# Patient Record
Sex: Male | Born: 1940 | Race: White | Hispanic: No | Smoking: Former smoker
Health system: Southern US, Community
[De-identification: ages and names within clinical notes are randomized; demographics above are authoritative.]

## PROBLEM LIST (undated history)

## (undated) DIAGNOSIS — K219 Gastro-esophageal reflux disease without esophagitis: Secondary | ICD-10-CM

## (undated) DIAGNOSIS — E785 Hyperlipidemia, unspecified: Secondary | ICD-10-CM

## (undated) DIAGNOSIS — I2510 Atherosclerotic heart disease of native coronary artery without angina pectoris: Secondary | ICD-10-CM

## (undated) HISTORY — DX: Hyperlipidemia, unspecified: E78.5

## (undated) HISTORY — PX: ANGIOPLASTY: SHX39

## (undated) HISTORY — PX: CORONARY ANGIOPLASTY: SHX604

---

## 2011-10-29 ENCOUNTER — Inpatient Hospital Stay (HOSPITAL_COMMUNITY)
Admission: EM | Admit: 2011-10-29 | Discharge: 2011-11-01 | DRG: 247 | Disposition: A | Discharge status: Home / Self Care | Payer: Medicare Other | Attending: Cardiology | Admitting: Cardiology

## 2011-10-29 ENCOUNTER — Inpatient Hospital Stay (HOSPITAL_COMMUNITY): Payer: Medicare Other

## 2011-10-29 ENCOUNTER — Other Ambulatory Visit: Payer: Self-pay

## 2011-10-29 ENCOUNTER — Ambulatory Visit (HOSPITAL_COMMUNITY): Admit: 2011-10-29 | Payer: Self-pay | Admitting: Cardiology

## 2011-10-29 ENCOUNTER — Encounter (HOSPITAL_COMMUNITY)
Admission: EM | Disposition: A | Discharge status: Home / Self Care | Payer: Self-pay | Source: Home / Self Care | Attending: Cardiology

## 2011-10-29 DIAGNOSIS — I2510 Atherosclerotic heart disease of native coronary artery without angina pectoris: Secondary | ICD-10-CM

## 2011-10-29 DIAGNOSIS — I2119 ST elevation (STEMI) myocardial infarction involving other coronary artery of inferior wall: Secondary | ICD-10-CM

## 2011-10-29 DIAGNOSIS — I472 Ventricular tachycardia, unspecified: Secondary | ICD-10-CM | POA: Diagnosis not present

## 2011-10-29 DIAGNOSIS — Z87891 Personal history of nicotine dependence: Secondary | ICD-10-CM

## 2011-10-29 DIAGNOSIS — Z951 Presence of aortocoronary bypass graft: Secondary | ICD-10-CM

## 2011-10-29 DIAGNOSIS — E7800 Pure hypercholesterolemia, unspecified: Secondary | ICD-10-CM | POA: Diagnosis present

## 2011-10-29 DIAGNOSIS — Z720 Tobacco use: Secondary | ICD-10-CM | POA: Diagnosis present

## 2011-10-29 DIAGNOSIS — I213 ST elevation (STEMI) myocardial infarction of unspecified site: Secondary | ICD-10-CM

## 2011-10-29 DIAGNOSIS — I4729 Other ventricular tachycardia: Secondary | ICD-10-CM | POA: Diagnosis not present

## 2011-10-29 HISTORY — DX: Gastro-esophageal reflux disease without esophagitis: K21.9

## 2011-10-29 HISTORY — PX: PERCUTANEOUS CORONARY STENT INTERVENTION (PCI-S): SHX5485

## 2011-10-29 HISTORY — DX: Atherosclerotic heart disease of native coronary artery without angina pectoris: I25.10

## 2011-10-29 HISTORY — DX: ST elevation (STEMI) myocardial infarction involving other coronary artery of inferior wall: I21.19

## 2011-10-29 HISTORY — PX: LEFT HEART CATHETERIZATION WITH CORONARY ANGIOGRAM: SHX5451

## 2011-10-29 LAB — COMPREHENSIVE METABOLIC PANEL
ALT: 21 U/L (ref 0–53)
AST: 103 U/L — ABNORMAL HIGH (ref 0–37)
Albumin: 3.8 g/dL (ref 3.5–5.2)
Alkaline Phosphatase: 33 U/L — ABNORMAL LOW (ref 39–117)
Calcium: 8.6 mg/dL (ref 8.4–10.5)
Creatinine, Ser: 0.68 mg/dL (ref 0.50–1.35)
GFR calc Af Amer: >90 mL/min (ref >90)
Glucose, Bld: 117 mg/dL — ABNORMAL HIGH (ref 70–99)
Potassium: 3.8 mEq/L (ref 3.5–5.1)
Sodium: 141 mEq/L (ref 135–145)
Total Protein: 6.7 g/dL (ref 6.0–8.3)
Total Protein: 6.8 g/dL (ref 6.0–8.3)

## 2011-10-29 LAB — DIFFERENTIAL
Basophils Absolute: 0 10*3/uL (ref 0.0–0.1)
Eosinophils Absolute: 0 10*3/uL (ref 0.0–0.7)
Eosinophils Relative: 0 % (ref 0–5)
Neutrophils Relative %: 87 % — ABNORMAL HIGH (ref 43–77)

## 2011-10-29 LAB — CBC
Hemoglobin: 14.9 g/dL (ref 13.0–17.0)
MCH: 32.2 pg (ref 26.0–34.0)
MCHC: 34.6 g/dL (ref 30.0–36.0)
MCV: 91.8 fL (ref 78.0–100.0)
Platelets: 176 10*3/uL (ref 150–400)
RBC: 4.67 MIL/uL (ref 4.22–5.81)
RDW: 12.6 % (ref 11.5–15.5)
WBC: 13.3 10*3/uL — ABNORMAL HIGH (ref 4.0–10.5)

## 2011-10-29 LAB — MRSA PCR SCREENING: MRSA by PCR: NEGATIVE

## 2011-10-29 LAB — CARDIAC PANEL(CRET KIN+CKTOT+MB+TROPI)
CK, MB: 121.1 ng/mL (ref 0.3–4.0)
CK, MB: 136 ng/mL (ref 0.3–4.0)
Total CK: 1155 U/L — ABNORMAL HIGH (ref 7–232)
Troponin I: 11.96 ng/mL (ref <0.30)
Troponin I: >25 ng/mL (ref <0.30)

## 2011-10-29 LAB — POCT I-STAT, CHEM 8
BUN: 10 mg/dL (ref 6–23)
Chloride: 105 mEq/L (ref 96–112)
Creatinine, Ser: 0.9 mg/dL (ref 0.50–1.35)
Potassium: 3.9 mEq/L (ref 3.5–5.1)
Sodium: 141 mEq/L (ref 135–145)
TCO2: 24 mmol/L (ref 0–100)

## 2011-10-29 LAB — POCT I-STAT TROPONIN I

## 2011-10-29 LAB — TSH: TSH: 0.624 u[IU]/mL (ref 0.350–4.500)

## 2011-10-29 SURGERY — PERCUTANEOUS CORONARY STENT INTERVENTION (PCI-S)
Anesthesia: LOCAL

## 2011-10-29 MED ORDER — ASPIRIN 81 MG PO CHEW: 264.0000 mg | CHEWABLE_TABLET | Freq: Once | ORAL | Status: AC

## 2011-10-29 MED ORDER — ASPIRIN 81 MG PO CHEW: 81.0000 mg | CHEWABLE_TABLET | Freq: Every day | ORAL | Status: DC

## 2011-10-29 MED ORDER — SIMVASTATIN 40 MG PO TABS
40.0000 mg | ORAL_TABLET | Freq: Every day | ORAL | Status: DC
  Administered 2011-10-29 – 2011-10-31 (×3): 40 mg via ORAL
  Filled 2011-10-29 (×4): qty 1

## 2011-10-29 MED ORDER — FENTANYL CITRATE 0.05 MG/ML IJ SOLN
INTRAMUSCULAR | Status: AC
  Filled 2011-10-29: qty 2

## 2011-10-29 MED ORDER — SODIUM CHLORIDE 0.9 % IV SOLN: INTRAVENOUS | Status: AC

## 2011-10-29 MED ORDER — NITROGLYCERIN IN D5W 200-5 MCG/ML-% IV SOLN: 2.0000 ug/min | INTRAVENOUS | Status: DC

## 2011-10-29 MED ORDER — MORPHINE SULFATE 2 MG/ML IJ SOLN: 2.0000 mg | INTRAMUSCULAR | Status: DC | PRN

## 2011-10-29 MED ORDER — SODIUM CHLORIDE 0.9 % IV SOLN
30.0000 mL | INTRAVENOUS | Status: DC
  Administered 2011-10-29: 30 mL via INTRAVENOUS

## 2011-10-29 MED ORDER — VERAPAMIL HCL 2.5 MG/ML IV SOLN
INTRAVENOUS | Status: AC
  Filled 2011-10-29: qty 2

## 2011-10-29 MED ORDER — PRASUGREL HCL 10 MG PO TABS: 10.0000 mg | ORAL_TABLET | Freq: Every day | ORAL | Status: DC

## 2011-10-29 MED ORDER — ENOXAPARIN SODIUM 30 MG/0.3ML ~~LOC~~ SOLN
30.0000 mg | SUBCUTANEOUS | Status: DC
  Filled 2011-10-29: qty 0.3

## 2011-10-29 MED ORDER — HEPARIN (PORCINE) IN NACL 2-0.9 UNIT/ML-% IJ SOLN
INTRAMUSCULAR | Status: AC
  Filled 2011-10-29: qty 2000

## 2011-10-29 MED ORDER — ALPRAZOLAM 0.25 MG PO TABS: 0.2500 mg | ORAL_TABLET | Freq: Two times a day (BID) | ORAL | Status: DC | PRN

## 2011-10-29 MED ORDER — LIDOCAINE HCL (PF) 1 % IJ SOLN
INTRAMUSCULAR | Status: AC
  Filled 2011-10-29: qty 30

## 2011-10-29 MED ORDER — BIVALIRUDIN 250 MG IV SOLR
INTRAVENOUS | Status: AC
  Filled 2011-10-29: qty 250

## 2011-10-29 MED ORDER — ASPIRIN 81 MG PO CHEW
81.0000 mg | CHEWABLE_TABLET | Freq: Every day | ORAL | Status: DC
  Administered 2011-10-30 – 2011-11-01 (×3): 81 mg via ORAL
  Filled 2011-10-29 (×3): qty 1

## 2011-10-29 MED ORDER — ASPIRIN EC 81 MG PO TBEC: 81.0000 mg | DELAYED_RELEASE_TABLET | Freq: Every day | ORAL | Status: DC

## 2011-10-29 MED ORDER — METOPROLOL TARTRATE 12.5 MG HALF TABLET
12.5000 mg | ORAL_TABLET | Freq: Two times a day (BID) | ORAL | Status: DC
  Administered 2011-10-29 – 2011-11-01 (×7): 12.5 mg via ORAL
  Filled 2011-10-29 (×11): qty 1

## 2011-10-29 MED ORDER — ASPIRIN 81 MG PO CHEW
CHEWABLE_TABLET | ORAL | Status: AC
  Administered 2011-10-29: 243 mg
  Filled 2011-10-29: qty 3

## 2011-10-29 MED ORDER — ONDANSETRON HCL 4 MG/2ML IJ SOLN
INTRAMUSCULAR | Status: AC
  Filled 2011-10-29: qty 2

## 2011-10-29 MED ORDER — HEPARIN SODIUM (PORCINE) 5000 UNIT/ML IJ SOLN
INTRAMUSCULAR | Status: AC
  Administered 2011-10-29: 4000 [IU] via INTRAVENOUS
  Filled 2011-10-29: qty 1

## 2011-10-29 MED ORDER — ONDANSETRON HCL 4 MG/2ML IJ SOLN: 4.0000 mg | Freq: Four times a day (QID) | INTRAMUSCULAR | Status: DC | PRN

## 2011-10-29 MED ORDER — NITROGLYCERIN 0.2 MG/ML ON CALL CATH LAB
INTRAVENOUS | Status: AC
  Filled 2011-10-29: qty 1

## 2011-10-29 MED ORDER — NITROGLYCERIN 0.4 MG SL SUBL: 0.4000 mg | SUBLINGUAL_TABLET | SUBLINGUAL | Status: DC | PRN

## 2011-10-29 MED ORDER — ASPIRIN 81 MG PO CHEW: 324.0000 mg | CHEWABLE_TABLET | ORAL | Status: DC

## 2011-10-29 MED ORDER — ATROPINE SULFATE 1 MG/ML IJ SOLN
INTRAMUSCULAR | Status: AC
  Filled 2011-10-29: qty 1

## 2011-10-29 MED ORDER — PRASUGREL HCL 10 MG PO TABS
10.0000 mg | ORAL_TABLET | Freq: Every day | ORAL | Status: DC
  Administered 2011-10-30 – 2011-11-01 (×3): 10 mg via ORAL
  Filled 2011-10-29 (×3): qty 1

## 2011-10-29 MED ORDER — ACETAMINOPHEN 325 MG PO TABS: 650.0000 mg | ORAL_TABLET | ORAL | Status: DC | PRN

## 2011-10-29 MED ORDER — PRASUGREL HCL 10 MG PO TABS
ORAL_TABLET | ORAL | Status: AC
  Filled 2011-10-29: qty 6

## 2011-10-29 MED ORDER — ASPIRIN 300 MG RE SUPP
300.0000 mg | RECTAL | Status: DC
  Filled 2011-10-29: qty 1

## 2011-10-29 MED ORDER — HEPARIN SODIUM (PORCINE) 5000 UNIT/ML IJ SOLN
4000.0000 [IU] | INTRAMUSCULAR | Status: AC
  Administered 2011-10-29: 4000 [IU] via INTRAVENOUS

## 2011-10-29 MED ORDER — SODIUM CHLORIDE 0.9 % IV SOLN: INTRAVENOUS | Status: DC

## 2011-10-29 MED ORDER — MIDAZOLAM HCL 2 MG/2ML IJ SOLN
INTRAMUSCULAR | Status: AC
  Filled 2011-10-29: qty 2

## 2011-10-29 MED ORDER — NITROGLYCERIN IN D5W 200-5 MCG/ML-% IV SOLN
INTRAVENOUS | Status: AC
  Filled 2011-10-29: qty 250

## 2011-10-29 MED ORDER — ASPIRIN 325 MG PO TABS: 325.0000 mg | ORAL_TABLET | Freq: Every day | ORAL | Status: DC

## 2011-10-29 MED ORDER — SODIUM CHLORIDE 0.45 % IV SOLN: INTRAVENOUS | Status: DC

## 2011-10-29 MED ORDER — ADULT MULTIVITAMIN W/MINERALS CH
1.0000 | ORAL_TABLET | Freq: Every day | ORAL | Status: DC
  Administered 2011-10-29 – 2011-11-01 (×4): 1 via ORAL
  Filled 2011-10-29 (×4): qty 1

## 2011-10-29 NOTE — ED Notes (Signed)
MD at bedside.Dr Riley Kill with Cardiology and Dr. Anitra Lauth at bedside with patient.

## 2011-10-29 NOTE — Progress Notes (Signed)
Call to Dr. Riley Kill re:  10 beat run of v-tach, 6 beat runs of v-tach, occas couplet, salvo, bigeminal pvc's.  Pt totally asymptomatic. Aware metoprolol 2.5mg  just given.  No new orders given.  Pt talking with friends in room.  Denies chest pain or shortness of breath.

## 2011-10-29 NOTE — Op Note (Signed)
Cardiac Catheterization Procedure Note  Name: Kenneth Booth MRN: [id number] DOB: 1941-03-04  Procedure: Placement of catheters without left heart cath, Selective Coronary Angiography, , PTCA and stenting of the RCA times two with DES with adjunctive PCI without stent placement  Indication:  This gentleman is 61 yea rs old. He presented with chest pain, and EMS was called. Electrocardiogram demonstrated mild inferior ST elevation. Coat stem he was called by the [facility], and he was brought to the emergency room. The patient has previous stenting of the right coronary artery performed at [facility] in [place].  This was done in the early 1990s. He has ongoing chest pain. He was given intravenous unfractionated heparin in the emergency room as well as chewable aspirin. We discussed the indications for urgent catheterization with the patient. He consented to proceed. He currently takes no medications related to his heart.  Procedural Details:  The right wrist was prepped, draped, and anesthetized with 1% lidocaine. Using the modified Seldinger technique, a 6 French sheath was introduced into the right radial artery. 3 mg of verapamil was administered through the sheath, weight-based unfractionated heparin was administered intravenously in the ER as noted. Standard Judkins catheters were used for selective coronary angiography. Catheter exchanges were performed over an exchange length guidewire.  The patient was then noted to have a total occlusion of the right coronary artery. Bivalirudin was administered in a weight-based fashion and an ACT was checked and found to be appropriate for intervention. Prasugrel was given as 60mg  orally.  A JR 4 guiding catheter with sideholes was then utilized to cannulate the right coronary artery. A pro-water coronary guidewire was then passed across the total occlusion and into the distal vessel. Following this, a 2.25 mm balloon was then inflated in the  distal artery and reperfusion achieved approximately 33 minutes after arriving in the catheterization laboratory. The lesion was carefully assessed, and we discussed the long-term options with the patient as the anatomy would suggest that revascularization surgery would be a survival benefit to him. I explained to him that this might alter the current strategy. He was fairly emphatic about the hope to avoid surgery, much as he had been in the early 1990s according to his history. A Promus Element stent drug-eluting stent, 2.75 x 24 mm, was then placed in the distal vessel and expanded to 15 atmospheres. A 3.25 mm noncompliant balloon was used to post dilate this lesion. In the mid vessel, at the site of prior stenting from the early 90s there is partial in-stent renarrowing and a fairly high-grade lesion noted at the distal end of the stented area.  After careful evaluation, this entire area was stented with a 3.0 x 32 mm Promus Element DES. Post dilatation was then performed using both a 3.5 and 3.75 noncompliant balloons. The distal end of the stent was postdilated with a 3.75 mm balloon to high pressures. Beyond the distal stent, there was a  short focal area of narrowing in an area of segmental plaque. To cover the entire area of plaque would've required a fairly long additional drug-eluting stent, and I chose to treat the short focal area with a 2.5 x 8 noncompliant balloon this was reduced from about 70% down to 30% with good residual lumen. The patient was pain-free and improved. I elected to get left ventricular function data through echocardiography as opposed to ventriculography at this time. The patient remained stable. Bivalirudin was discontinued. He TR band was placed with 15 mm and  good hemostasis achieved. There were no major complications.  PROCEDURAL FINDINGS Hemodynamics:  AO 125/75 (97) LV not performed   Coronary angiography: Coronary dominance: right  Left mainstem: The left main is  stem artery has about 20-30% diffuse plaquing with a focal area of 20% narrowing in its midportion.  Left anterior descending (LAD): Left anterior descending artery is calcified and diffusely diseased. The first diagonal branch has multiple lesions and provides a fairly large bifurcating diagonal which is graftable. There are  high grade 90% proximal stenosis followed by an area of segmental irregular plaque beyond the diagonal vessel distally.  The mid left anterior descending artery has severe disease with up to 80-90% narrowing and then is diffusely diseased in its midportion the apical vessel, while not large in caliber does appear to be suitable for grafting.  Left circumflex (LCx): The circumflex artery is a large-caliber vessel providing one major marginal branch and appears free of significant disease.  Right coronary artery (RCA): The right coronary artery has been previously stented. In the midportion, there is approximately 50% to 60% narrowing throughout the stent followed by an area of focal disease at the distal end of the stent. Beyond the origin of the large posterior descending branch, the right coronary artery is totally occluded. Following reperfusion, there was an area of segmental disease in the continuation branch that was 90%. This was stented with a final residual stenosis of 0%. In the area of segmental disease in the previously stented area, which is the mid vessel, this area was stented with a residual stenosis of 10-20% but improved appearing lumen with excellent flow. Beyond the distal stent, there was a focal area of disease superimposed on a somewhat segmental area of plaque. The focal disease was dilated simply with the balloon. We avoided stenting this area because it would required another long stent in tandem with the other distal stent. Final angiographic result was satisfactory. With with reperfusion of the right coronary artery, there was restoration of TIMI-3 flow, and a  viable potential angiographic result.   PCI Data: Vessel - RCA/Segment - 3 Percent Stenosis (pre)  100 TIMI-flow 0 Stent Promus Element 2.75 by 24 mm.  Post dil 3.53mm Percent Stenosis (post) 0 TIMI-flow (post) 3   Second lesion:  In stent restenosis mid vessel---3.0 by 32 Promus Element DES post dil to 3.75 mm  10-20% narrowing Third lesion:  PLA  75 to 30 balloon only.   See dictation  Final Conclusions:   1.  inferior wall myocardial infarction. 2. Successful PCI of the right coronary artery involving stenting of the total occlusion, stenting of the mid vessel in-stent restenosis, and balloon angioplasty of a small focal distal lesion. 3. Severe disease involving the left anterior descending and diagonal arteries is noted in the above text.   Recommendations:  I will continue to discuss the case with the patient. Given the high grade left anterior descending and diagonal disease, the patient has a survival benefit for considering revascularization surgery. We thoroughly discussed this with the patient during the case. As in 1990, he was very reluctant to consider surgery. We attempted a more complete revascularization of the right coronary artery as a result. It would still potentially be suitable for grafting with a large posterior descending and posterolateral branches, with segmental plaque involving the posterolateral segment. I will continue to discuss these options with him.  Shawnie Pons, MD, [facility], [facility] Interventional Cardiology [facility] 10/29/2011, 1:18 PM

## 2011-10-29 NOTE — H&P (Signed)
Physician Discharge Summary  Patient ID: PRIYANSH PRY MRN: [id number] DOB/AGE: 1941/09/05 70 y.o. Admit date: 10/29/2011  Primary Care Physician:No primary provider on file.  Primary Cardiologist: Riley Kill  HPI: Mr. Vivona is a 70 year old male patient who has been lost to cardiac followup with known history of CAD with angioplasty of unknown artery in the 1980s per patient. He was seen at [facility] for the angioplasty.  The patient was in his usual state of health when he had returned from vacation the day before and was getting out to get ready for the day when he had sudden onset of chest pain. He states it was significant pressure 7/10 in duration with associated diaphoresis and weakness. He also states that he has been having some lung congestion and sinus issues or chronic cough. He's had recent dental surgery and has pain in his mouth and in his gums. He called EMS when symptoms did not subside and was brought to [facility]. Marland Kitchen He presented to the emergency room and was found to have an inferior ST elevation and was brought emergently to cardiac catheterization lab. The patient admits to having medical followup and on no medications prior to this admission he states that he has not see a physician very often. He does not have a primary care as well. The patient was seen and examined by myself and Dr. Maisie Fus that he in the cardiac catheterization lab. Initial films and cardiac catheterization lab revealing distal RCA occlusion. Please see Dr. Carles Collet thorough cardiac catheterization note for more details. Review of systems complete and found to be negative unless listed above  Past Medical History  Diagnosis Date  . Coronary artery disease     Family History  Problem Relation Age of Onset  . Coronary artery disease Brother     CABG    History   Social History  . Marital Status: Married    Spouse Name: N/A    Number of Children: N/A  . Years of Education: N/A    Occupational History  . Retired    Social History Main Topics  . Smoking status: Former Games developer  . Smokeless tobacco: Former Neurosurgeon    Quit date: 10/28/1981  . Alcohol Use: 3.5 oz/week    7 drink(s) per week  . Drug Use: No  . Sexually Active: Not on file   Other Topics Concern  . Not on file   Social History Narrative   Married    Past Surgical History  Procedure Date  . Angioplasty       (Not in a hospital admission)  Physical Exam: Blood pressure 136/94, pulse 69, temperature 98 F (36.7 C), temperature source Oral, resp. rate 13, height 5\' 10"  (1.778 m), weight 185 lb (83.915 kg), SpO2 98.00%.  General: Well developed, well nourished, in no acute distress Head: Eyes PERRLA, No xanthomas.   Normal cephalic and atramatic  Lungs: Clear bilaterally to auscultation and percussion. Heart: HRRR S1 S2, without MRG.  Pulses are 2+ & equal.            No carotid bruit. No JVD.  No abdominal bruits. No femoral bruits. Abdomen: Bowel sounds are positive, abdomen soft and non-tender without masses or                  Hernia's noted. Msk:  Back normal, normal gait. Normal strength and tone for age. Extremities: No clubbing, cyanosis or edema.  DP +1 Neuro: Alert and oriented X 3. Psych:  Good affect,  responds appropriately  Lab Results  Component Value Date   WBC 13.9* 10/29/2011   HGB 15.3 10/29/2011   HCT 45.0 10/29/2011   MCV 92.3 10/29/2011   PLT 168 10/29/2011    Lab 10/29/11 1038  NA 141  K 3.9  CL 105  CO2 --  BUN 10  CREATININE 0.90  CALCIUM --  PROT --  BILITOT --  ALKPHOS --  ALT --  AST --  GLUCOSE 120*       EKG: Inferior ST elevation.  ASSESSMENT AND PLAN:  1. Acute ST elevated inferior MI: Emergent cardiac catheterization per Dr. Inocente Salles in the cath lab revealing distal RCA occlusion. The patient is treated currently with PCI please see Dr. Carles Collet thorough cardiac catheterization note for more details concerning this. The patient will be  placed on aspirin statin data blocker ACE inhibitor and admitted to the ICU for further evaluation and monitoring. Will be important for the patient to followup with cardiology routinely from this point on. We will order lipids and LFTs for continued evaluation. More recommendations and interventions per Dr. Tedra Senegal per hospital course. Signed: Joni Reining 10/29/2011, 11:05 AM Co-Sign MD   Patient seen and examined in the cath lab.  Emergent cath recommended.  Agree with above.   Shawnie Pons 11:55 AM 11/05/2011

## 2011-10-29 NOTE — ED Provider Notes (Addendum)
History     CSN: [id number] Arrival date & time: 10/29/2011 10:28 AM   First MD Initiated Contact with Patient 10/29/11 1030      Chief Complaint  Patient presents with  . Chest Pain  . Code STEMI    (Consider location/radiation/quality/duration/timing/severity/associated sxs/prior treatment) Patient is a 70 y.o. male presenting with chest pain. The history is provided by the patient.  Chest Pain The chest pain began 1 - 2 hours ago. Chest pain occurs constantly. The chest pain is unchanged. Associated with: nothing. At its most intense, the pain is at 8/10. The pain is currently at 6/10. The severity of the pain is moderate. The quality of the pain is described as aching, pressure-like and tightness. The pain does not radiate. Exacerbated by: nothing. Primary symptoms include cough. Pertinent negatives for primary symptoms include no fever, no syncope, no wheezing, no nausea and no vomiting.  Associated symptoms include diaphoresis.  Pertinent negatives for associated symptoms include no lower extremity edema. He tried nothing for the symptoms. Risk factors include male gender.  His past medical history is significant for CAD.  Pertinent negatives for past medical history include no diabetes and no hyperlipidemia.  His family medical history is significant for heart disease in family.  Procedure history is positive for cardiac catheterization.     Past Medical History  Diagnosis Date  . Coronary artery disease     Past Surgical History  Procedure Date  . Angioplasty     History reviewed. No pertinent family history.  History  Substance Use Topics  . Smoking status: Never Smoker   . Smokeless tobacco: Not on file  . Alcohol Use: No      Review of Systems  Constitutional: Positive for diaphoresis. Negative for fever.  Respiratory: Positive for cough. Negative for wheezing.   Cardiovascular: Positive for chest pain. Negative for syncope.  Gastrointestinal: Negative  for nausea and vomiting.  All other systems reviewed and are negative.    Allergies  Review of patient's allergies indicates no known allergies.  Home Medications  No current outpatient prescriptions on file.  BP 128/85  Pulse 72  Temp(Src) 98 F (36.7 C) (Oral)  Resp 24  SpO2 96%  Physical Exam  Nursing note and vitals reviewed. Constitutional: He is oriented to person, place, and time. He appears well-developed and well-nourished. No distress.  HENT:  Head: Normocephalic and atraumatic.  Mouth/Throat: Oropharynx is clear and moist.  Eyes: Conjunctivae and EOM are normal. Pupils are equal, round, and reactive to light.  Neck: Normal range of motion. Neck supple.  Cardiovascular: Normal rate, regular rhythm and intact distal pulses.   No murmur heard. Pulmonary/Chest: Effort normal and breath sounds normal. No respiratory distress. He has no wheezes. He has no rales.  Abdominal: Soft. He exhibits no distension. There is no tenderness. There is no rebound and no guarding.  Musculoskeletal: Normal range of motion. He exhibits no edema and no tenderness.  Neurological: He is alert and oriented to person, place, and time.  Skin: Skin is warm and dry. No rash noted. No erythema.  Psychiatric: He has a normal mood and affect. His behavior is normal.    ED Course  Procedures (including critical care time)  Labs Reviewed  POCT I-STAT, CHEM 8 - Abnormal; Notable for the following:    Glucose, Bld 120 (*)    Calcium, Ion 1.06 (*)    All other components within normal limits  I-STAT TROPONIN I  I-STAT, CHEM 8  I-STAT TROPONIN  I  CBC  COMPREHENSIVE METABOLIC PANEL  I-STAT, CHEM 8  PROTIME-INR  APTT   No results found.   Date: 10/29/2011  Rate: 65  Rhythm: normal sinus rhythm  QRS Axis: normal  Intervals: normal  ST/T Wave abnormalities: ST depressions inferiorly  Conduction Disutrbances:none  Narrative Interpretation:   Old EKG Reviewed: none available  CRITICAL  CARE Performed by: Gwyneth Sprout   Total critical care time: 30  Critical care time was exclusive of separately billable procedures and treating other patients.  Critical care was necessary to treat or prevent imminent or life-threatening deterioration.  Critical care was time spent personally by me on the following activities: development of treatment plan with patient and/or surrogate as well as nursing, discussions with consultants, evaluation of patient's response to treatment, examination of patient, obtaining history from patient or surrogate, ordering and performing treatments and interventions, ordering and review of laboratory studies, ordering and review of radiographic studies, pulse oximetry and re-evaluation of patient's condition.   1. STEMI (ST elevation myocardial infarction)       MDM   Patient presented with acute STEMI with good story of chest pain, diaphoresis that started today with persistent chest pain. After 3 nitroglycerin and aspirin by EMS the pain is only mildly improved. It does not radiate. He does have a history of stent placements over 9 years ago and is currently off all medications does not take aspirin or Plavix or any blood pressure or her lipid medication. Dr. Riley Kill at bedside EKG showing deep T wave inversion in the lateral leads and borderline ST elevation inferiorly with possibly some mild posterior changes as well. Code STEMI activated prior to patient's arrival labs, heparin, nitroglycerin drip started. Patient taken to the cath lab.        Gwyneth Sprout, MD 10/29/11 1036  Gwyneth Sprout, MD 10/29/11 1042

## 2011-10-29 NOTE — ED Notes (Signed)
Pt here as code stemi, had sscp this am at 0830 8/10. Became diphoretic. With ems had inverted t waves which furthur developed enroute to mc. Given asa 324 mg, 3 ntg, and 4 morphine by ems. Iv 20 g lac.

## 2011-10-30 DIAGNOSIS — I219 Acute myocardial infarction, unspecified: Secondary | ICD-10-CM

## 2011-10-30 DIAGNOSIS — I517 Cardiomegaly: Secondary | ICD-10-CM

## 2011-10-30 LAB — BASIC METABOLIC PANEL
CO2: 28 mEq/L (ref 19–32)
Chloride: 102 mEq/L (ref 96–112)
GFR calc non Af Amer: 85 mL/min — ABNORMAL LOW (ref >90)
Glucose, Bld: 110 mg/dL — ABNORMAL HIGH (ref 70–99)
Potassium: 3.9 mEq/L (ref 3.5–5.1)
Sodium: 137 mEq/L (ref 135–145)

## 2011-10-30 LAB — CBC
HCT: 40.4 % (ref 39.0–52.0)
Hemoglobin: 13.8 g/dL (ref 13.0–17.0)
MCH: 31.7 pg (ref 26.0–34.0)
RBC: 4.36 MIL/uL (ref 4.22–5.81)

## 2011-10-30 LAB — LIPID PANEL
Cholesterol: 181 mg/dL (ref 0–200)
Total CHOL/HDL Ratio: 4.2 RATIO
VLDL: 32 mg/dL (ref 0–40)

## 2011-10-30 MED FILL — Dextrose Inj 5%: INTRAVENOUS | Qty: 50 | Status: AC

## 2011-10-30 NOTE — Progress Notes (Signed)
Noted pt with recent MI and needs more revascularization. Please advise on ambulation. Thx. Ethelda Chick CES, ACSM

## 2011-10-30 NOTE — Progress Notes (Signed)
*  PRELIMINARY RESULTS* Echocardiogram 2D Echocardiogram has been performed.  Clide Deutscher RDCS 10/30/2011, 10:10 AM

## 2011-10-30 NOTE — Progress Notes (Signed)
Cardiology Progress Note Patient Name: Kenneth Booth Date of Encounter: 10/30/2011, 6:50 AM     Subjective  Patient c/o some chest congestion, but denies chest pain or shortness of breath. He had multiple episodes of nonsustained Vtach last night, but remained asymptomatic.   Objective   Telemetry: Sinus rhythm 50s-70s, with multiple episodes of nonsustained vtach (longest 10 beats) and PVCs  Medications: . aspirin  81 mg Oral Daily  . heparin  4,000 Units Intravenous STAT  . metoprolol tartrate  12.5 mg Oral BID  . mulitivitamin with minerals  1 tablet Oral Daily  . nitroGLYCERIN      . prasugrel  10 mg Oral Daily  . simvastatin  40 mg Oral q1800  . verapamil       Physical Exam: Temp:  [97.7 F (36.5 C)-98.9 F (37.2 C)] 98.9 F (37.2 C) (12/17 0400) Pulse Rate:  [56-86] 61  (12/17 0400) Resp:  [13-25] 16  (12/17 0300) BP: (95-136)/(53-94) 106/66 mmHg (12/17 0400) SpO2:  [95 %-100 %] 95 % (12/17 0400) FiO2 (%):  [2 %] 2 % (12/16 1330) Weight:  [83.915 kg (185 lb)] 185 lb (83.915 kg) (12/16 1029)  General: Well developed, well nourished, white male, in no acute distress. Head: Normocephalic, atraumatic, sclera non-icteric, nares are without discharge.  Neck: Supple. Negative for carotid bruits or JVD Lungs: Clear bilaterally to auscultation without wheezes, rales, or rhonchi. Breathing is unlabored. Heart: RRR S1 S2 without murmurs, rubs, or gallops.  Abdomen: Soft, non-tender, non-distended with normoactive bowel sounds. No rebound/guarding. No obvious abdominal masses. Msk:  Strength and tone appear normal for age. Extremities: Right wrist with dry intact dressing. No bruits. No edema. No clubbing or cyanosis. Distal pedal pulses are 2+ and equal bilaterally. Neuro: Alert and oriented X 3. Moves all extremities spontaneously. Psych:  Responds to questions appropriately with a normal affect.  Intake/Output Summary (Last 24 hours) at 10/30/11 0650 Last data  filed at 10/30/11 0000  Gross per 24 hour  Intake   1483 ml  Output   1275 ml  Net    208 ml    Labs:  Basename 10/30/11 0500 10/29/11 1447  NA 137 132*  K 3.9 4.4  CL 102 101  CO2 28 18*  GLUCOSE 110* 98  BUN 9 9  CREATININE 0.90 0.68  CALCIUM 8.7 8.6  MG -- 2.0   Basename 10/29/11 1447 10/29/11 1032  AST 103* 22  ALT 27 21  ALKPHOS 35* 33*  BILITOT 0.5 0.4  PROT 6.8 6.7  ALBUMIN 3.8 3.6   Basename 10/30/11 0500 10/29/11 1447  WBC 13.1* 13.3*  NEUTROABS -- 11.6*  HGB 13.8 16.0  HCT 40.4 45.6  MCV 92.7 91.8  PLT 162 176   Basename 10/29/11 1857 10/29/11 1447  CKTOTAL 1155* 951*  CKMB 136.0* 121.1*  TROPONINI >25.00* 11.96*   Basename 10/30/11 0500  CHOL 181  HDL 43  LDLCALC 106*  TRIG 159*  CHOLHDL 4.2   Basename 10/29/11 1447  TSH 0.624    Radiology/Studies:  10/29/11 - Left Heart Catheterization, Selective Coronary Angiography AO 125/75 (97)  LV not performed  Coronary dominance: right  Left mainstem: The left main is stem artery has about 20-30% diffuse plaquing with a focal area of 20% narrowing in its midportion.  Left anterior descending (LAD): Left anterior descending artery is calcified and diffusely diseased. The first diagonal branch has multiple lesions and provides a fairly large bifurcating diagonal which is graftable. There  are high grade 90% proximal stenosis followed by an area of segmental irregular plaque beyond the diagonal vessel distally. The mid left anterior descending artery has severe disease with up to 80-90% narrowing and then is diffusely diseased in its midportion the apical vessel, while not large in caliber does appear to be suitable for grafting.  Left circumflex (LCx): The circumflex artery is a large-caliber vessel providing one major marginal branch and appears free of significant disease.  Right coronary artery (RCA): The right coronary artery has been previously stented. In the midportion, there is approximately 50% to  60% narrowing throughout the stent followed by an area of focal disease at the distal end of the stent. Beyond the origin of the large posterior descending branch, the right coronary artery is totally occluded. Following reperfusion, there was an area of segmental disease in the continuation branch that was 90%. This was stented with a final residual stenosis of 0%. In the area of segmental disease in the previously stented area, which is the mid vessel, this area was stented with a residual stenosis of 10-20% but improved appearing lumen with excellent flow. Beyond the distal stent, there was a focal area of disease superimposed on a somewhat segmental area of plaque. The focal disease was dilated simply with the balloon. We avoided stenting this area because it would required another long stent in tandem with the other distal stent. Final angiographic result was satisfactory. With with reperfusion of the right coronary artery, there was restoration of TIMI-3 flow, and a viable potential angiographic result.  PCI Data:  Vessel - RCA/Segment - 3  Percent Stenosis (pre) 100  TIMI-flow 0  Stent Promus Element 2.75 by 24 mm. Post dil 3.33mm  Percent Stenosis (post) 0  TIMI-flow (post) 3  Second lesion: In stent restenosis mid vessel---3.0 by 32 Promus Element DES post dil to 3.75 mm 10-20% narrowing  Third lesion: PLA 75 to 30 balloon only. See dictation  Final Conclusions:  1. inferior wall myocardial infarction.  2. Successful PCI of the right coronary artery involving stenting of the total occlusion, stenting of the mid vessel in-stent restenosis, and balloon angioplasty of a small focal distal lesion.  3. Severe disease involving the left anterior descending and diagonal arteries is noted in the above text.   Dg Chest Port  10/29/2011  Findings: The heart is normal in size given the AP projection.  The mediastinal and hilar contours are normal.  The lungs are clear. No pleural effusion.  The bony  thorax is intact.  IMPRESSION: No acute cardiopulmonary findings.    Assessment and Plan  70yom w/ PMHx CAD (s/p PCI to RCA '90s) who presented to [facility] on 10/29/11 after having sudden onset chest pain and was found to have an inferior STEMI. He underwent emergent cardiac catheterization with DES x 2 to the RCA. Overnight he had multiple episodes of asymptomatic nonsustained ventricular tachycardia.  1. Acute Inferior STEMI: Patient doing well post PCI without any further chest pain.  - Cont ASA, Effient, BB, Statin, ACEI  2. Nonsustained Vtach: Received 2.5mg  Metoprolol after 10 beat run of vtach last night. Patient remained asymptomatic with stable vital signs. - Cont to monitor - Cont BB  Signed, HOPE, JESSICA PA-C  Patient stable and doing well.  I outlined his anatomy in detail again, and have recommended consultation with the surgeons.  He is not keen on the idea.  Plan increase in metoprolol to control rhythm, and continue to monitor.  Will try  to get old [facility] records from the 80s-90s  Shawnie Pons 2:29 PM 10/30/2011

## 2011-10-31 ENCOUNTER — Inpatient Hospital Stay (HOSPITAL_COMMUNITY): Payer: Medicare Other

## 2011-10-31 DIAGNOSIS — E7800 Pure hypercholesterolemia, unspecified: Secondary | ICD-10-CM | POA: Diagnosis present

## 2011-10-31 NOTE — Progress Notes (Signed)
CARDIAC REHAB PHASE I   PRE:  Rate/Rhythm: 79 SR    BP: sitting 114/66    SaO2:   MODE:  Ambulation: 390 ft   POST:  Rate/Rhythm: 91 SR    BP: sitting 121/70     SaO2:   Tolerated well. C/o cold sxs. No CP. Began ed. Interested in CRPII. Will send referral to [place].  [id number]  Harriet Masson CES, ACSM

## 2011-10-31 NOTE — Progress Notes (Signed)
Subjective:  He is doing pretty well.  No further ectopy of significance.  Discussed case at length with family, and again with patient today.  He under no circumstances wants surgery.  Old records reviewed from 1990, and in paper chart.  Had reocclusion of the LAD re-dilated by Dr. Deborah Chalk.  He eventually had stenting at [facility] of the RCA.  They told him LAD was not ideal.  Dr. Excell Seltzer and I looked at films again today.  No current chest pain.    Objective:  Vital Signs in the last 24 hours: Temp:  [98.4 F (36.9 C)-99.3 F (37.4 C)] 98.5 F (36.9 C) (12/18 0400) Pulse Rate:  [63-95] 95  (12/17 2144) Resp:  [16-24] 18  (12/18 0400) BP: (101-122)/(60-76) 113/61 mmHg (12/18 0600) SpO2:  [94 %-96 %] 96 % (12/18 0400) Weight:  [81.1 kg (178 lb 12.7 oz)] 178 lb 12.7 oz (81.1 kg) (12/18 0453)  Intake/Output from previous day: 12/17 0701 - 12/18 0700 In: 780 [P.O.:780] Out: 2220 [Urine:2220]   Physical Exam: General: Well developed, well nourished, in no acute distress. Head:  Normocephalic and atraumatic. Lungs: Clear to auscultation and percussion. Heart: Normal S1 and S2.  No murmur, rubs or gallops.  Pulses: Pulses normal in all 4 extremities. Extremities: No clubbing or cyanosis. No edema. Neurologic: Alert and oriented x 3.    Lab Results:  Basename 10/30/11 0500 10/29/11 1447  WBC 13.1* 13.3*  HGB 13.8 16.0  PLT 162 176    Basename 10/30/11 0500 10/29/11 1447  NA 137 132*  K 3.9 4.4  CL 102 101  CO2 28 18*  GLUCOSE 110* 98  BUN 9 9  CREATININE 0.90 0.68    Basename 10/29/11 1857 10/29/11 1447  TROPONINI >25.00* 11.96*   Hepatic Function Panel  Basename 10/29/11 1447  PROT 6.8  ALBUMIN 3.8  AST 103*  ALT 27  ALKPHOS 35*  BILITOT 0.5  BILIDIR --  IBILI --    Basename 10/30/11 0500  CHOL 181   No results found for this basename: PROTIME in the last 72 hours  Imaging: Dg Chest Port 1v Same Day  10/29/2011  *RADIOLOGY REPORT*  Clinical Data:  Myocardial infarction.  PORTABLE CHEST - 1 VIEW SAME DAY  Comparison: None  Findings: The heart is normal in size given the AP projection.  The mediastinal and hilar contours are normal.  The lungs are clear. No pleural effusion.  The bony thorax is intact.  IMPRESSION: No acute cardiopulmonary findings.  Original Report Authenticated By: P. Loralie Champagne, M.D.    EKG:  Inferior Qs.  Inferior and lateral T inversion.  No persistent ST elevation.  Cardiac Studies:  Cardiac echo report pending.  Assessment/Plan:  Patient Active Hospital Problem List: ST elevation myocardial infarction (STEMI) of inferior wall (10/29/2011)   Assessment: Doing well post MI   Plan: I have recommended that he consider CABG with extensive LAD and diagonal disease.  Dr.Cooper and I agree that the left system is not ideal for PCI, and that medical therapy makes the most sense in the absence of CABG..  Old records reviewed, and discussed with patient.  He prefers medical therapy, and we will continue CAD (coronary artery disease) (10/29/2011)   Assessment: Old records reviewed   Plan: see Plan. Hypercholesterolemia   Continue statins NSVT   Resolved.  Continue beta blockers. Cough   Normal exam.  Recheck PA and lateral CXR today.  Plan transfer.          Kenneth Booth  Riley Kill, MD, [facility], FSCAI 10/31/2011, 8:15 AM

## 2011-11-01 DIAGNOSIS — I2109 ST elevation (STEMI) myocardial infarction involving other coronary artery of anterior wall: Secondary | ICD-10-CM

## 2011-11-01 DIAGNOSIS — Z720 Tobacco use: Secondary | ICD-10-CM | POA: Diagnosis present

## 2011-11-01 MED ORDER — METOPROLOL TARTRATE 12.5 MG HALF TABLET: 12.5000 mg | ORAL_TABLET | Freq: Two times a day (BID) | ORAL | Status: DC

## 2011-11-01 MED ORDER — NITROGLYCERIN 0.4 MG SL SUBL: 0.4000 mg | SUBLINGUAL_TABLET | SUBLINGUAL | Status: DC | PRN

## 2011-11-01 MED ORDER — PRASUGREL HCL 10 MG PO TABS: 10.0000 mg | ORAL_TABLET | Freq: Every day | ORAL | Status: DC

## 2011-11-01 MED ORDER — SIMVASTATIN 40 MG PO TABS: 40.0000 mg | ORAL_TABLET | Freq: Every day | ORAL | Status: DC

## 2011-11-01 MED ORDER — ASPIRIN 81 MG PO CHEW: 81.0000 mg | CHEWABLE_TABLET | Freq: Every day | ORAL | Status: AC

## 2011-11-01 NOTE — Progress Notes (Addendum)
Ed completed. Voiced understanding and requests referral for CRPII. [id number] Ethelda Chick CES, ACSM

## 2011-11-01 NOTE — Discharge Summary (Signed)
Discharge Summary   Patient ID: Kenneth Booth MRN: [id number], DOB/AGE: 02-09-1941 70 y.o.  Primary MD: None Primary Cardiologist: Shawnie Pons MD  Admit date: 10/29/2011 D/C date:     11/01/2011      Primary Discharge Diagnoses:  1. STEMI/CAD  - s/p DES x 2 to the RCA. On 10/29/11  - High grade LAD and diagonal dz, pt declined CABG  - Initiated on ASA, Effient, BB, statin  Secondary Discharge Diagnoses:  1. Tobacco abuse 2. Hyperlipidemia - LDL 106, Initiated on statin  Allergies No Known Allergies  Diagnostic Studies/Procedures:  10/29/11 - Left Heart Catheterization, Selective Coronary Angiography  Hemodynamics: AO 125/75 (97)  LV not performed  Coronary dominance: right  Left mainstem: The left main is stem artery has about 20-30% diffuse plaquing with a focal area of 20% narrowing in its midportion.  Left anterior descending (LAD): Left anterior descending artery is calcified and diffusely diseased. The first diagonal branch has multiple lesions and provides a fairly large bifurcating diagonal which is graftable. There are high grade 90% proximal stenosis followed by an area of segmental irregular plaque beyond the diagonal vessel distally. The mid left anterior descending artery has severe disease with up to 80-90% narrowing and then is diffusely diseased in its midportion the apical vessel, while not large in caliber does appear to be suitable for grafting.  Left circumflex (LCx): The circumflex artery is a large-caliber vessel providing one major marginal branch and appears free of significant disease.  Right coronary artery (RCA): The right coronary artery has been previously stented. In the midportion, there is approximately 50% to 60% narrowing throughout the stent followed by an area of focal disease at the distal end of the stent. Beyond the origin of the large posterior descending branch, the right coronary artery is totally occluded. Following reperfusion,  there was an area of segmental disease in the continuation branch that was 90%. This was stented with a final residual stenosis of 0%. In the area of segmental disease in the previously stented area, which is the mid vessel, this area was stented with a residual stenosis of 10-20% but improved appearing lumen with excellent flow. Beyond the distal stent, there was a focal area of disease superimposed on a somewhat segmental area of plaque. The focal disease was dilated simply with the balloon. We avoided stenting this area because it would required another long stent in tandem with the other distal stent. Final angiographic result was satisfactory. With with reperfusion of the right coronary artery, there was restoration of TIMI-3 flow, and a viable potential angiographic result.  PCI Data:  Vessel - RCA/Segment - 3  Percent Stenosis (pre) 100  TIMI-flow 0  Stent Promus Element 2.75 by 24 mm. Post dil 3.58mm  Percent Stenosis (post) 0  TIMI-flow (post) 3  Second lesion: In stent restenosis mid vessel---3.0 by 32 Promus Element DES post dil to 3.75 mm 10-20% narrowing  Third lesion: PLA 75 to 30 balloon only. See dictation  Final Conclusions:  1. inferior wall myocardial infarction.  2. Successful PCI of the right coronary artery involving stenting of the total occlusion, stenting of the mid vessel in-stent restenosis, and balloon angioplasty of a small focal distal lesion.  3. Severe disease involving the left anterior descending and diagonal arteries is noted in the above text.    History of Present Illness: 70yom w/ PMHx CAD (s/p PCI to RCA '90s) and tobacco abuse who presented to [facility] on 10/29/11 after having sudden  onset chest pain and was found to have an inferior STEMI.   The patient was in his usual state of health when he returned from vacation the day before admission and was getting ready for the day when he had sudden onset of chest pain. He stated it was significant pressure 7/10  with associated diaphoresis and weakness. He also reported having some lung congestion and sinus issues or chronic cough. He called EMS when symptoms did not subside and was brought to the ER.  Hospital Course: He presented to the emergency room and was found to have an inferior STEMI and underwent emergent cardiac catheterization with findings as above and is now s/p DES x 2 to the RCA. Given his high grade LAD and diagonal disease it was discussed with the patient that he would benefit from revascularization surgery. However, the patient was very reluctant to consider surgery and after further lengthy discussions with the patient and family he declined CABG and opted for medical therapy only. He was initiated on ASA, Effient, Metoprolol, and a statin.  He had multiple episodes of asymptomatic nonsustained ventricular tachycardia that resolved with uptitration of his beta blocker. He had no further chest pain and was able to ambulate with cardiac rehab without difficulty. He was counseled on smoking cessation.   He was discharged in stable condition to home with plans for cardiac rehabilitation and follow up with Dr. Riley Kill as scheduled below. He was instructed not to drive.   Discharge Vitals: Blood pressure 119/82, pulse 75, temperature 98.4 F (36.9 C), temperature source Oral, resp. rate 18, height 5\' 10"  (1.778 m), weight 80.1 kg (176 lb 9.4 oz), SpO2 95.00%.  Labs: Component Value Date   WBC 13.1* 10/30/2011   HGB 13.8 10/30/2011   HCT 40.4 10/30/2011   MCV 92.7 10/30/2011   PLT 162 10/30/2011    Lab 10/30/11 0500 10/29/11 1447  NA 137 --  K 3.9 --  CL 102 --  CO2 28 --  BUN 9 --  CREATININE 0.90 --  CALCIUM 8.7 --  PROT -- 6.8  BILITOT -- 0.5  ALKPHOS -- 35*  ALT -- 27  AST -- 103*  GLUCOSE 110* --   Basename 10/29/11 1857 10/29/11 1447  CKTOTAL 1155* 951*  CKMB 136.0* 121.1*  TROPONINI >25.00* 11.96*   Component Value Date   CHOL 181 10/30/2011   HDL 43 10/30/2011     LDLCALC 106* 10/30/2011   TRIG 159* 10/30/2011    10/29/2011 14:47  TSH 0.624    Discharge Medications   Current Discharge Medication List    START taking these medications   Details  aspirin 81 MG chewable tablet Chew 1 tablet (81 mg total) by mouth daily.    metoprolol tartrate (LOPRESSOR) 12.5 mg TABS Take 0.5 tablets (12.5 mg total) by mouth 2 (two) times daily. Qty: 30 tablet, Refills: 6    nitroGLYCERIN (NITROSTAT) 0.4 MG SL tablet Place 1 tablet (0.4 mg total) under the tongue every 5 (five) minutes as needed for chest pain. Qty: 25 tablet, Refills: 3    prasugrel (EFFIENT) 10 MG TABS Take 1 tablet (10 mg total) by mouth daily. Qty: 30 tablet, Refills: 6    simvastatin (ZOCOR) 40 MG tablet Take 1 tablet (40 mg total) by mouth daily at 6 PM. Qty: 30 tablet, Refills: 6      CONTINUE these medications which have NOT CHANGED   Details  acetaminophen (TYLENOL) 325 MG tablet Take 650 mg by mouth once.  b complex vitamins capsule Take 1 capsule by mouth daily.      Multiple Vitamin (MULITIVITAMIN WITH MINERALS) TABS Take 1 tablet by mouth daily.      vitamin C (ASCORBIC ACID) 500 MG tablet Take 500 mg by mouth daily.         Disposition    Diet - low sodium heart healthy      Increase activity slowly      Discharge instructions      Comments:   **PLEASE REMEMBER TO BRING ALL OF YOUR MEDICATIONS TO EACH OF YOUR FOLLOW-UP OFFICE VISITS.  * KEEP WRIST CATHETERIZATION SITE CLEAN AND DRY. Call the office for any signs of bleeding, pus, swelling, increased pain, or any other concerns. * NO HEAVY LIFTING (>10lbs) X 4 WEEKS. * NO SEXUAL ACTIVITY X 4 WEEKS. * NO SOAKING BATHS, HOT TUBS, POOLS, ETC., X 7 DAYS. * NO DRIVING until seen by Dr. Riley Kill   Follow-up Information    Follow up with Tereso Newcomer, PA on 11/22/2011. (9:30)    Contact information:   [facility] [address] [place] [address] [phone]      Outstanding Labs/Studies: PT WILL REQUIRE F/U LIPIDS AND LFT'S IN 6-8 WKS AS WE INITIATED A NEW STATIN DURING THIS HOSPITALIZATION.  Duration of Discharge Encounter: Greater than 30 minutes including physician and PA time.  Signed, Mikya Don PA-C 11/01/2011, 1:16 PM

## 2011-11-01 NOTE — Progress Notes (Signed)
Subjective:  Doing ok.  No chest pain.  Feels good. HR went up this am --got emotional as they have a lot of family problems.  It all went up when he was talking about this.  Echo is not in EPIC, but shows EF 55-60%.    Objective:  Vital Signs in the last 24 hours: Temp:  [98.2 F (36.8 C)-98.5 F (36.9 C)] 98.4 F (36.9 C) (12/19 0458) Pulse Rate:  [75-93] 75  (12/19 0458) Resp:  [18-20] 18  (12/19 0458) BP: (119-150)/(70-104) 119/82 mmHg (12/19 0458) SpO2:  [95 %-98 %] 95 % (12/19 0458) Weight:  [80.1 kg (176 lb 9.4 oz)] 176 lb 9.4 oz (80.1 kg) (12/19 0458)  Intake/Output from previous day: 12/18 0701 - 12/19 0700 In: 240 [P.O.:240] Out: 500 [Urine:500]   Physical Exam: General: Well developed, well nourished, in no acute distress. Head:  Normocephalic and atraumatic. Lungs: Clear to auscultation and percussion. Heart: Normal S1 and S2.  No murmur, rubs or gallops.  Pulses: Pulses normal in all 4 extremities. Extremities: No clubbing or cyanosis. No edema. Neurologic: Alert and oriented x 3.    Lab Results:  Basename 10/30/11 0500 10/29/11 1447  WBC 13.1* 13.3*  HGB 13.8 16.0  PLT 162 176    Basename 10/30/11 0500 10/29/11 1447  NA 137 132*  K 3.9 4.4  CL 102 101  CO2 28 18*  GLUCOSE 110* 98  BUN 9 9  CREATININE 0.90 0.68    Basename 10/29/11 1857 10/29/11 1447  TROPONINI >25.00* 11.96*   Hepatic Function Panel  Basename 10/29/11 1447  PROT 6.8  ALBUMIN 3.8  AST 103*  ALT 27  ALKPHOS 35*  BILITOT 0.5  BILIDIR --  IBILI --    Basename 10/30/11 0500  CHOL 181   No results found for this basename: PROTIME in the last 72 hours  Imaging: Dg Chest 2 View  10/31/2011  *RADIOLOGY REPORT*  Clinical Data: Cough.  Gastroesophageal reflux.  Coronary artery disease.  Hypercholesterolemia.  CHEST - 2 VIEW  Comparison: 10/29/2011  Findings: Heart size remains normal.  Both lungs are clear.  No evidence of pleural effusion.  No mass or lymphadenopathy  identified.  Coronary artery calcification versus prior stent placement noted on the lateral projection.  IMPRESSION: No active cardiopulmonary disease.  Original Report Authenticated By: Danae Orleans, M.D.      Cardiac Studies: See report when it arrives in EPIC  Assessment/Plan:  Patient Active Hospital Problem List: ST elevation myocardial infarction (STEMI) of inferior wall (10/29/2011)   Assessment: stable.   Plan: Home today.  Favor rehab.  Smoking cessation.  DAPT, lipid lowering  CAD (coronary artery disease) (10/29/2011)   Assessment: Old studies reviewed   Plan: Patient wants medical therapy. Hypercholesterolemia (10/31/2011)   Assessment: LDL elevated   Plan: Statin therapy.  Patient told not to drive.  More than 30 minutes of DC time       Shawnie Pons, MD, [facility], [facility] 11/01/2011, 9:16 AM

## 2011-11-01 NOTE — Progress Notes (Signed)
Patient given discharge instructions and medication list.  Patient and family verbalized understanding.  Instructions on radial cath site care reviewed and verbalized understanding.  Nickolas Madrid

## 2011-11-01 NOTE — Progress Notes (Signed)
Utilization review completed. Kinzie Wickes, RN, BSN. 11/01/11  

## 2011-11-22 ENCOUNTER — Encounter: Payer: Self-pay | Admitting: Physician Assistant

## 2011-11-22 ENCOUNTER — Ambulatory Visit (INDEPENDENT_AMBULATORY_CARE_PROVIDER_SITE_OTHER): Payer: Medicare Other | Admitting: Physician Assistant

## 2011-11-22 DIAGNOSIS — I10 Essential (primary) hypertension: Secondary | ICD-10-CM

## 2011-11-22 DIAGNOSIS — I2510 Atherosclerotic heart disease of native coronary artery without angina pectoris: Secondary | ICD-10-CM

## 2011-11-22 DIAGNOSIS — E785 Hyperlipidemia, unspecified: Secondary | ICD-10-CM

## 2011-11-22 MED ORDER — METOPROLOL TARTRATE 12.5 MG HALF TABLET: 25.0000 mg | ORAL_TABLET | Freq: Two times a day (BID) | ORAL | Status: DC

## 2011-11-22 MED ORDER — LISINOPRIL 2.5 MG PO TABS: 2.5000 mg | ORAL_TABLET | Freq: Every day | ORAL | Status: DC

## 2011-11-22 NOTE — Patient Instructions (Addendum)
Your physician recommends that you schedule a follow-up appointment in: 1 month Your physician has recommended you make the following change in your medication: INCREASE Metoprolol 25 mg twice daily  START Lisinopril 2.5 mg daily You may take Claritin 10 mg daily as needed; Robitussin DM as needed; or Coricidin HBP as needed for colds Your physician recommends that you return for lab work in: Monday 1/14 for BMP and 6 weeks for Lipid and Liver profiles Your physician has requested that you have an echocardiogram. Echocardiography is a painless test that uses sound waves to create images of your heart. It provides your doctor with information about the size and shape of your heart and how well your heart's chambers and valves are working. This procedure takes approximately one hour. There are no restrictions for this procedure.

## 2011-11-22 NOTE — Progress Notes (Signed)
[address] [place], [place]  [address] Phone: [phone] Fax:  [phone]  Date:  11/22/2011   Name:  Kenneth Booth       DOB:  10-23-1941 MRN:  [id number]  PCP:  None  Primary Cardiologist:  Dr.  Shawnie Pons  Primary Electrophysiologist:  None    History of Present Illness: Kenneth Booth is a 71 y.o. male who presents for post hospital follow up.  He has a history of CAD, status post PCI to the RCA in the 1990s and had subsequently been lost to followup.  He was admitted 12/16-12/19 with an inferior ST MI.  He presented with sudden onset of chest pain.  Emergent LHC 10/29/11: Mid left main 20% with diffuse 20-30%, LAD calcified and diffusely diseased with proximal 90%, mid 80-90%; circumflex okay; mid RCA stent 50-60% ISR, occluded after a large PDA.  PCI: Promus DES x2 (one DES tx ISR of old stent) and distal vessel balloon angioplasty.  Given the high-grade LAD and diagonal disease, it was felt the patient would benefit from CABG.  However, he declined this and opted for medical therapy only.  He did have NSVT that resolved with increasing doses of beta blocker therapy.  Labs: Hemoglobin 13.8, potassium 3.9, creatinine 0.9, ALT 27, TC 181, HDL 43, LDL 106, TG 159, TSH 0.624, chest x-ray unremarkable.  He is doing well.  The patient denies chest pain, shortness of breath, syncope, orthopnea, PND or significant pedal edema.  He is interested in cardiac rehab.    Past Medical History  Diagnosis Date  . Coronary artery disease     inf. STEMI 12/12:  LHC 10/29/11: Mid left main 20% with diffuse 20-30%, LAD calcified and diffusely diseased with proximal 90%, mid 80-90%; circumflex okay; mid RCA stent 50-60% ISR, occluded after a large PDA.  PCI: Promus DES x2 (one DES tx ISR of old stent) and distal vessel balloon angioplasty.  Given the high-grade LAD and diagonal disease, CABG rec for LAD - pt declined  . GERD (gastroesophageal reflux disease)   .  HLD (hyperlipidemia)     Current Outpatient Prescriptions  Medication Sig Dispense Refill  . acetaminophen (TYLENOL) 325 MG tablet Take 650 mg by mouth once.       Marland Kitchen aspirin 81 MG chewable tablet Chew 1 tablet (81 mg total) by mouth daily.      Marland Kitchen b complex vitamins capsule Take 1 capsule by mouth daily.        . metoprolol tartrate (LOPRESSOR) 12.5 mg TABS Take 0.5 tablets (12.5 mg total) by mouth 2 (two) times daily.  30 tablet  6  . Multiple Vitamin (MULITIVITAMIN WITH MINERALS) TABS Take 1 tablet by mouth daily.        . nitroGLYCERIN (NITROSTAT) 0.4 MG SL tablet Place 1 tablet (0.4 mg total) under the tongue every 5 (five) minutes as needed for chest pain.  25 tablet  3  . prasugrel (EFFIENT) 10 MG TABS Take 1 tablet (10 mg total) by mouth daily.  30 tablet  6  . simvastatin (ZOCOR) 40 MG tablet Take 1 tablet (40 mg total) by mouth daily at 6 PM.  30 tablet  6  . vitamin C (ASCORBIC ACID) 500 MG tablet Take 500 mg by mouth daily.         Allergies: No Known Allergies  History  Substance Use Topics  . Smoking status: Former Games developer  . Smokeless tobacco: Former Neurosurgeon    Quit date:  10/28/1981  . Alcohol Use: 3.5 oz/week    7 drink(s) per week     ROS:  Please see the history of present illness.   Notes recent URI symptoms.  No fevers.  Cough productive of clear sputum.  All other systems reviewed and negative.   PHYSICAL EXAM: VS:  BP 130/85  Pulse 83  Ht 5\' 10"  (1.778 m)  Wt 185 lb (83.915 kg)  BMI 26.54 kg/m2 Well nourished, well developed, in no acute distress HEENT: normal Neck: no JVD Cardiac:  normal S1, S2; RRR; no murmur Lungs:  clear to auscultation bilaterally, no wheezing, rhonchi or rales Abd: soft, nontender, no hepatomegaly Ext: no edema; right radial site without hematoma or bruit Skin: warm and dry Neuro:  CNs 2-12 intact, no focal abnormalities noted  EKG:   Sinus rhythm, heart rate 83, left axis deviation, inferior Q waves, T wave inversion in lead 2, 3,  aVF, V5-V6, no change from prior  ASSESSMENT AND PLAN:

## 2011-11-22 NOTE — Assessment & Plan Note (Signed)
Check lipids and LFTs in 6 weeks. 

## 2011-11-22 NOTE — Assessment & Plan Note (Addendum)
He is doing well, post MI.  He has been contacted by cardiac rehabilitation and plans to pursue this.  I do not see an assessment of his LV function at the time of this MI.  There appears to be 2 appointments for an echocardiogram in the system that had been canceled.  He is still not interested in pursuing CABG for his LAD disease. 1. Increase metoprolol to 25 mg twice daily 2. Add lisinopril 2.5 mg daily 3. Check a basic metabolic panel in one week. 4. Obtain an echocardiogram to assess his LV function. 5. Followup with Dr.  Shawnie Pons in one month.

## 2011-11-27 ENCOUNTER — Other Ambulatory Visit: Payer: Medicare Other | Admitting: *Deleted

## 2011-11-27 ENCOUNTER — Other Ambulatory Visit (INDEPENDENT_AMBULATORY_CARE_PROVIDER_SITE_OTHER): Payer: Medicare Other | Admitting: *Deleted

## 2011-11-27 DIAGNOSIS — I10 Essential (primary) hypertension: Secondary | ICD-10-CM

## 2011-11-27 DIAGNOSIS — E785 Hyperlipidemia, unspecified: Secondary | ICD-10-CM

## 2011-11-27 DIAGNOSIS — I2510 Atherosclerotic heart disease of native coronary artery without angina pectoris: Secondary | ICD-10-CM

## 2011-11-27 LAB — HEPATIC FUNCTION PANEL
ALT: 28 U/L (ref 0–53)
Bilirubin, Direct: 0.2 mg/dL (ref 0.0–0.3)
Total Bilirubin: 1.1 mg/dL (ref 0.3–1.2)

## 2011-11-27 LAB — BASIC METABOLIC PANEL
BUN: 11 mg/dL (ref 6–23)
Creatinine, Ser: 0.9 mg/dL (ref 0.4–1.5)
GFR: 85.76 mL/min (ref >60.00)
Glucose, Bld: 115 mg/dL — ABNORMAL HIGH (ref 70–99)
Potassium: 3.7 mEq/L (ref 3.5–5.1)

## 2011-11-27 LAB — LIPID PANEL: VLDL: 14.4 mg/dL (ref 0.0–40.0)

## 2011-12-06 ENCOUNTER — Ambulatory Visit (HOSPITAL_COMMUNITY): Payer: Medicare Other | Attending: Cardiology | Admitting: Radiology

## 2011-12-06 DIAGNOSIS — E785 Hyperlipidemia, unspecified: Secondary | ICD-10-CM | POA: Insufficient documentation

## 2011-12-06 DIAGNOSIS — I252 Old myocardial infarction: Secondary | ICD-10-CM | POA: Insufficient documentation

## 2011-12-06 DIAGNOSIS — I2510 Atherosclerotic heart disease of native coronary artery without angina pectoris: Secondary | ICD-10-CM

## 2011-12-06 DIAGNOSIS — I10 Essential (primary) hypertension: Secondary | ICD-10-CM | POA: Insufficient documentation

## 2012-01-02 ENCOUNTER — Ambulatory Visit (INDEPENDENT_AMBULATORY_CARE_PROVIDER_SITE_OTHER): Payer: Medicare Other | Admitting: *Deleted

## 2012-01-02 ENCOUNTER — Encounter: Payer: Self-pay | Admitting: Physician Assistant

## 2012-01-02 ENCOUNTER — Encounter: Payer: Self-pay | Admitting: *Deleted

## 2012-01-02 ENCOUNTER — Telehealth: Payer: Self-pay | Admitting: *Deleted

## 2012-01-02 ENCOUNTER — Ambulatory Visit (INDEPENDENT_AMBULATORY_CARE_PROVIDER_SITE_OTHER): Payer: Medicare Other | Admitting: Physician Assistant

## 2012-01-02 VITALS — BP 144/88 | HR 66 | Ht 70.0 in | Wt 192.0 lb

## 2012-01-02 DIAGNOSIS — E7800 Pure hypercholesterolemia, unspecified: Secondary | ICD-10-CM

## 2012-01-02 DIAGNOSIS — I2589 Other forms of chronic ischemic heart disease: Secondary | ICD-10-CM

## 2012-01-02 DIAGNOSIS — K219 Gastro-esophageal reflux disease without esophagitis: Secondary | ICD-10-CM | POA: Insufficient documentation

## 2012-01-02 DIAGNOSIS — I2510 Atherosclerotic heart disease of native coronary artery without angina pectoris: Secondary | ICD-10-CM

## 2012-01-02 DIAGNOSIS — I255 Ischemic cardiomyopathy: Secondary | ICD-10-CM

## 2012-01-02 LAB — HEPATIC FUNCTION PANEL
ALT: 31 U/L (ref 0–53)
Alkaline Phosphatase: 29 U/L — ABNORMAL LOW (ref 39–117)
Bilirubin, Direct: 0.1 mg/dL (ref 0.0–0.3)
Total Protein: 6.8 g/dL (ref 6.0–8.3)

## 2012-01-02 LAB — LIPID PANEL
Total CHOL/HDL Ratio: 3
Triglycerides: 44 mg/dL (ref 0.0–149.0)

## 2012-01-02 MED ORDER — ATORVASTATIN CALCIUM 80 MG PO TABS: 80.0000 mg | ORAL_TABLET | Freq: Every day | ORAL | Status: DC

## 2012-01-02 MED ORDER — CARVEDILOL 6.25 MG PO TABS: 6.2500 mg | ORAL_TABLET | Freq: Two times a day (BID) | ORAL | Status: DC

## 2012-01-02 MED ORDER — PANTOPRAZOLE SODIUM 40 MG PO TBEC: 40.0000 mg | DELAYED_RELEASE_TABLET | Freq: Every day | ORAL | Status: DC

## 2012-01-02 NOTE — Assessment & Plan Note (Signed)
Chest pain seems to be consistent with GERD.  Start PPI (protonix 40 mg QD).

## 2012-01-02 NOTE — Telephone Encounter (Signed)
Message copied by Tarri Fuller on Tue Jan 02, 2012  2:24 PM ------      Message from: [place], [place] T      Created: Tue Jan 02, 2012  1:24 PM       LDL 90      With recent MI and residual CAD, I would like to see LDL < 70      I suggest we change medications      D/c Simvastatin      Start Atorvastatin (Lipitor) 80 mg QD      Repeat FLP and LFTs in 2 mos.      Tereso Newcomer, PA-C  1:22 PM 01/02/2012

## 2012-01-02 NOTE — Assessment & Plan Note (Signed)
Volume stable.  Change metoprolol to coreg 6.25 mg bid.  Continue ACE.  Follow up with Dr.  Shawnie Pons in 3-4 weeks.

## 2012-01-02 NOTE — Assessment & Plan Note (Signed)
Continue statin. 

## 2012-01-02 NOTE — Assessment & Plan Note (Signed)
Doing well.  He denies any symptoms consistent with angina.  Continue aspirin and Effient.  Still not interested in CABG.  Follow up with Dr.  Shawnie Pons in 3-4 weeks.

## 2012-01-02 NOTE — Progress Notes (Signed)
[address] [place], [place]  [address] Phone: [phone] Fax:  [phone]  Date:  01/02/2012   Name:  Kenneth Booth       DOB:  [birth year] MRN:  [id number]  PCP:  None  Primary Cardiologist:  Dr.  Shawnie Pons  Primary Electrophysiologist:  None    History of Present Illness: Kenneth Booth is a 71 y.o. male who presents for follow up.  He has a history of CAD, status post PCI to the RCA in the 1990s and had subsequently been lost to followup.  He was admitted 12/16-12/19 with an inferior STEMI.  He presented with sudden onset of chest pain.  Emergent LHC 10/29/11: Mid left main 20% with diffuse 20-30%, LAD calcified and diffusely diseased with proximal 90%, mid 80-90%; circumflex okay; mid RCA stent 50-60% ISR, occluded after a large PDA.  PCI: Promus DES x2 to RCA (one DES tx ISR of old stent) and distal vessel balloon angioplasty.  Given the high-grade LAD and diagonal disease, it was felt the patient would benefit from CABG.  However, he declined this and opted for medical therapy only.    I saw him in follow up last month.  Follow up echo 12/06/11: septal and apical HK, EF 40%, mild LAE.  Follow up labs: ALT 28, K 3.7, Creatinine 0.9, TC 136, TG 72, HDL 52, LDL 70.  He is doing well.  He is very active at work.  Denies any chest pain with exertion or dyspnea.  Notes left chest pressure from time to time.  Seems to get worse with certain meals.  He has a hiatal hernia.  Has a lot of "heartburn" too.  Takes TUMS for this.  Denies orthopnea, PND or edema.  No syncope.  Taking all of his medications.    Past Medical History  Diagnosis Date  . Coronary artery disease     inf. STEMI 12/12:  LHC 10/29/11: Mid left main 20% with diffuse 20-30%, LAD calcified and diffusely diseased with proximal 90%, mid 80-90%; circumflex okay; mid RCA stent 50-60% ISR, occluded after a large PDA.  PCI: Promus DES x2 (one DES tx ISR of old stent) and distal vessel  balloon angioplasty.  Given the high-grade LAD and diagonal disease, CABG rec for LAD - pt declined  . GERD (gastroesophageal reflux disease)   . HLD (hyperlipidemia)     Current Outpatient Prescriptions  Medication Sig Dispense Refill  . acetaminophen (TYLENOL) 325 MG tablet Take 650 mg by mouth once.       Marland Kitchen aspirin 81 MG chewable tablet Chew 1 tablet (81 mg total) by mouth daily.      Marland Kitchen b complex vitamins capsule Take 1 capsule by mouth daily.        Kenneth Booth 580 MG CAPS Take by mouth 2 (two) times daily.      Marland Kitchen lisinopril (ZESTRIL) 2.5 MG tablet Take 1 tablet (2.5 mg total) by mouth daily.  30 tablet  6  . metoprolol tartrate (LOPRESSOR) 25 MG tablet Take 25 mg by mouth 2 (two) times daily.      . Multiple Vitamin (MULITIVITAMIN WITH MINERALS) TABS Take 1 tablet by mouth daily.        . nitroGLYCERIN (NITROSTAT) 0.4 MG SL tablet Place 1 tablet (0.4 mg total) under the tongue every 5 (five) minutes as needed for chest pain.  25 tablet  3  . prasugrel (EFFIENT) 10 MG TABS Take 1 tablet (10 mg total)  by mouth daily.  30 tablet  6  . simvastatin (ZOCOR) 40 MG tablet Take 1 tablet (40 mg total) by mouth daily at 6 PM.  30 tablet  6  . vitamin C (ASCORBIC ACID) 500 MG tablet Take 500 mg by mouth daily.         Allergies: No Known Allergies  History  Substance Use Topics  . Smoking status: Former Games developer  . Smokeless tobacco: Former Neurosurgeon    Quit date: 10/28/1981  . Alcohol Use: 3.5 oz/week    7 drink(s) per week     ROS:  Please see the history of present illness.   Notes dry cough.  No worse with ACE.  Has chronic occasional hemorrhoidal bleeding.  No changes.  Denies dysphagia, odynophagia, hematemesis.  All other systems reviewed and negative.   PHYSICAL EXAM: VS:  BP 144/88  Pulse 66  Ht 5\' 10"  (1.778 m)  Wt 192 lb (87.091 kg)  BMI 27.55 kg/m2 Well nourished, well developed, in no acute distress HEENT: normal Neck: no JVD Cardiac:  normal S1, S2; RRR; no murmur Lungs:   clear to auscultation bilaterally, no wheezing, rhonchi or rales Abd: soft, nontender, no hepatomegaly Ext: no edema  Skin: warm and dry Neuro:  CNs 2-12 intact, no focal abnormalities noted  EKG:   NSR, HR 66, LAD, inf Q waves, TW inversions 2, 3, aVF, V5-6, no change from prior  ASSESSMENT AND PLAN:

## 2012-01-02 NOTE — Telephone Encounter (Signed)
pt notified of lab results and to stop simvastatin and start atorvastatin 80 mg qhs, repeat labs in 2 months. Kenneth Booth New rx sent in today for atorvastatin 80 mg qhs to [facility]. Kenneth Booth

## 2012-01-02 NOTE — Patient Instructions (Addendum)
Your physician has recommended you make the following change in your medication: STOP METOPROLOL; START COREG 6.25 MG TWICE DAILY, START PROTONIX 40 DAILY  3-4 WEEKS WITH DR. Riley Kill PER SCOTT WEAVER, PA-C.

## 2012-02-08 ENCOUNTER — Ambulatory Visit (INDEPENDENT_AMBULATORY_CARE_PROVIDER_SITE_OTHER): Payer: Medicare Other | Admitting: Cardiology

## 2012-02-08 ENCOUNTER — Ambulatory Visit: Payer: Medicare Other | Admitting: Cardiology

## 2012-02-08 ENCOUNTER — Encounter: Payer: Self-pay | Admitting: Cardiology

## 2012-02-08 VITALS — BP 146/72 | HR 64 | Ht 70.0 in | Wt 187.8 lb

## 2012-02-08 DIAGNOSIS — F17200 Nicotine dependence, unspecified, uncomplicated: Secondary | ICD-10-CM

## 2012-02-08 DIAGNOSIS — E7800 Pure hypercholesterolemia, unspecified: Secondary | ICD-10-CM

## 2012-02-08 DIAGNOSIS — I2589 Other forms of chronic ischemic heart disease: Secondary | ICD-10-CM

## 2012-02-08 DIAGNOSIS — I255 Ischemic cardiomyopathy: Secondary | ICD-10-CM

## 2012-02-08 DIAGNOSIS — I2510 Atherosclerotic heart disease of native coronary artery without angina pectoris: Secondary | ICD-10-CM

## 2012-02-08 DIAGNOSIS — Z720 Tobacco use: Secondary | ICD-10-CM

## 2012-02-08 MED ORDER — LISINOPRIL 5 MG PO TABS: 5.0000 mg | ORAL_TABLET | Freq: Every day | ORAL | Status: DC

## 2012-02-08 NOTE — Assessment & Plan Note (Signed)
Reviewed films with patient in great detail.  He understands and accepts that surgery might be of benefit to him, but is not considering.  Will do an exercise GXT to assess residual funtional ischemia.  Doubt nuclear would help, so will do routine to see tolerance and ST change with exercise.  Patient agreeable to this.

## 2012-02-08 NOTE — Patient Instructions (Addendum)
Your physician recommends that you return for a FASTING LIPID, BMP and LIVER Profile--nothing to eat or drink after midnight, lab opens at 8:30--please have labs drawn in one week  Your physician has requested that you have an exercise tolerance test in 3-4 WEEKS with Dr Riley Kill. For further information please visit [email or web address] Please also follow instruction sheet, as given.  Your physician has recommended you make the following change in your medication: INCREASE Lisinopril to 5mg  take one by mouth daily

## 2012-02-08 NOTE — Assessment & Plan Note (Signed)
Reluctant about med change, but increase lisinopril to 5mg .  EF is 40% with inferior WMA.

## 2012-02-08 NOTE — Assessment & Plan Note (Signed)
Quit in 1988

## 2012-02-08 NOTE — Assessment & Plan Note (Signed)
Needs lipid and liver on 80mg  atorvastatin.

## 2012-02-08 NOTE — Progress Notes (Signed)
HPI:  He is in for follow up.  In general he is doing well.  He denies any chest pain.  I reviewed his films.  He says under no circumstance would he consider any bypass procedure.  He was told many years ago at [facility] he was ok, and does not want to consider even after looking at films  Denies exertional chest pain.  Diagonal and LAD are not favorable for long term PCI.  CFX is not severely disease, and the RCA was successfully treated acutely.  Not having any symptoms.  He did agree to a GXT.    Current Outpatient Prescriptions  Medication Sig Dispense Refill  . acetaminophen (TYLENOL) 325 MG tablet Take 650 mg by mouth as needed.       Marland Kitchen aspirin 81 MG chewable tablet Chew 1 tablet (81 mg total) by mouth daily.      Marland Kitchen atorvastatin (LIPITOR) 80 MG tablet Take 1 tablet (80 mg total) by mouth daily.  30 tablet  11  . B Complex-C-Folic Acid (STRESS FORMULA PO) Take by mouth 2 (two) times daily.      [name] 580 MG CAPS Take by mouth 2 (two) times daily.      . Garlic 1250 MG TABS Take 1,250 mg by mouth daily.      Marland Kitchen lisinopril (ZESTRIL) 2.5 MG tablet Take 1 tablet (2.5 mg total) by mouth daily.  30 tablet  6  . metoprolol succinate (TOPROL-XL) 25 MG 24 hr tablet Take 25 mg by mouth daily.      . metoprolol tartrate (LOPRESSOR) 25 MG tablet Take 25 mg by mouth 2 (two) times daily.      . nitroGLYCERIN (NITROSTAT) 0.4 MG SL tablet Place 1 tablet (0.4 mg total) under the tongue every 5 (five) minutes as needed for chest pain.  25 tablet  3  . pantoprazole (PROTONIX) 40 MG tablet Take 40 mg by mouth daily. everyother day      . prasugrel (EFFIENT) 10 MG TABS Take 1 tablet (10 mg total) by mouth daily.  30 tablet  6  . vitamin C (ASCORBIC ACID) 500 MG tablet Take 500 mg by mouth daily.         No Known Allergies  Past Medical History  Diagnosis Date  . Coronary artery disease     inf. STEMI 12/12:  LHC 10/29/11: Mid left main 20% with diffuse 20-30%, LAD calcified and diffusely diseased with  proximal 90%, mid 80-90%; circumflex okay; mid RCA stent 50-60% ISR, occluded after a large PDA.  PCI: Promus DES x2 (one DES tx ISR of old stent) and distal vessel balloon angioplasty.  Given the high-grade LAD and diagonal disease, CABG rec for LAD - pt declined  . GERD (gastroesophageal reflux disease)   . HLD (hyperlipidemia)     Past Surgical History  Procedure Date  . Angioplasty   . Coronary angioplasty     1980 [facility]    Family History  Problem Relation Age of Onset  . Coronary artery disease Brother     CABG  . Heart disease Brother     History   Social History  . Marital Status: Married    Spouse Name: N/A    Number of Children: N/A  . Years of Education: N/A   Occupational History  . Retired    Social History Main Topics  . Smoking status: Former Games developer  . Smokeless tobacco: Former Neurosurgeon    Quit date: 10/28/1981  . Alcohol  Use: 3.5 oz/week    7 drink(s) per week  . Drug Use: 7 per week  . Sexually Active: Yes   Other Topics Concern  . Not on file   Social History Narrative   Married    ROS: Please see the HPI.  All other systems reviewed and negative.  PHYSICAL EXAM:  BP 146/72  Pulse 64  Ht 5\' 10"  (1.778 m)  Wt 187 lb 12.8 oz (85.186 kg)  BMI 26.95 kg/m2  General: Well developed, well nourished, in no acute distress. Head:  Normocephalic and atraumatic. Neck: no JVD Lungs: Clear to auscultation and percussion. Heart: Normal S1 and S2.  No murmur, rubs or gallops.  Abdomen:  Normal bowel sounds; soft; non tender; no organomegaly Pulses: Pulses normal in all 4 extremities. Extremities: No clubbing or cyanosis. No edema. Neurologic: Alert and oriented x 3.  ECG from 2-19 reviewed ' ASSESSMENT AND PLAN:

## 2012-02-13 ENCOUNTER — Other Ambulatory Visit (INDEPENDENT_AMBULATORY_CARE_PROVIDER_SITE_OTHER): Payer: Medicare Other

## 2012-02-13 DIAGNOSIS — Z720 Tobacco use: Secondary | ICD-10-CM

## 2012-02-13 DIAGNOSIS — E7800 Pure hypercholesterolemia, unspecified: Secondary | ICD-10-CM

## 2012-02-13 DIAGNOSIS — F17200 Nicotine dependence, unspecified, uncomplicated: Secondary | ICD-10-CM

## 2012-02-13 DIAGNOSIS — I2510 Atherosclerotic heart disease of native coronary artery without angina pectoris: Secondary | ICD-10-CM

## 2012-02-13 DIAGNOSIS — I2589 Other forms of chronic ischemic heart disease: Secondary | ICD-10-CM

## 2012-02-13 DIAGNOSIS — I255 Ischemic cardiomyopathy: Secondary | ICD-10-CM

## 2012-02-13 LAB — BASIC METABOLIC PANEL
BUN: 12 mg/dL (ref 6–23)
Calcium: 8.6 mg/dL (ref 8.4–10.5)
GFR: 86.78 mL/min (ref >60.00)
Glucose, Bld: 104 mg/dL — ABNORMAL HIGH (ref 70–99)
Sodium: 140 mEq/L (ref 135–145)

## 2012-02-13 LAB — HEPATIC FUNCTION PANEL
Albumin: 4.2 g/dL (ref 3.5–5.2)
Alkaline Phosphatase: 31 U/L — ABNORMAL LOW (ref 39–117)
Total Bilirubin: 0.7 mg/dL (ref 0.3–1.2)

## 2012-02-13 LAB — LIPID PANEL
Cholesterol: 146 mg/dL (ref 0–200)
HDL: 53.5 mg/dL (ref >39.00)
VLDL: 18.6 mg/dL (ref 0.0–40.0)

## 2012-03-07 ENCOUNTER — Encounter: Payer: Self-pay | Admitting: Cardiology

## 2012-03-07 ENCOUNTER — Ambulatory Visit (INDEPENDENT_AMBULATORY_CARE_PROVIDER_SITE_OTHER): Payer: Medicare Other | Admitting: Cardiology

## 2012-03-07 DIAGNOSIS — I2510 Atherosclerotic heart disease of native coronary artery without angina pectoris: Secondary | ICD-10-CM

## 2012-03-07 NOTE — Procedures (Signed)
Exercise Treadmill Test  Pre-Exercise Testing Evaluation Rhythm: normal sinus  Rate: 74   PR:  .16 QRS:  .08  QT:  .39 QTc: .43     Test  Exercise Tolerance Test Ordering MD: Shawnie Pons, MD  Interpreting MD:  Shawnie Pons, MD  Unique Test No: 1  Treadmill:  1  Indication for ETT: known ASHD  Contraindication to ETT: No   Stress Modality: exercise - treadmill  Cardiac Imaging Performed: non   Protocol: standard Bruce - maximal  Max BP: /184/53  Max MPHR (bpm):  152 85% MPR (bpm):  129  MPHR obtained (bpm):  137 % MPHR obtained:  90  Reached 85% MPHR (min:sec):  6:00 Total Exercise Time (min-sec):  6:26  Workload in METS:  7.6 Borg Scale: 15  Reason ETT Terminated:  fatigue    ST Segment Analysis At Rest: non-specific ST segment slurring With Exercise: non-specific ST changes  Other Information Arrhythmia:  No Angina during ETT:  absent (0) Quality of ETT:  diagnostic  ETT Interpretation:  normal - no evidence of ischemia by ST analysis  Comments: The patient exercise well.  He went nearly 7 minutes, with no chest pain or ST segment change.  There were occasional VPBs.  No angina  Recommendations: The patient has multivessel disease. He has advanced disease of the LAD, but has no interest in CABG.  This study does not reflect exercise induced chest pain or ST depression despite a good workload, a good finding.  He remains on medical therapy.  We will continue to follow him closely.Marland Kitchen

## 2012-03-12 ENCOUNTER — Other Ambulatory Visit: Payer: Medicare Other

## 2012-03-26 ENCOUNTER — Other Ambulatory Visit: Payer: Self-pay

## 2012-03-26 MED ORDER — PANTOPRAZOLE SODIUM 40 MG PO TBEC: 40.0000 mg | DELAYED_RELEASE_TABLET | Freq: Every day | ORAL | Status: DC

## 2012-03-26 NOTE — Telephone Encounter (Signed)
.   Requested Prescriptions   Pending Prescriptions Disp Refills  . pantoprazole (PROTONIX) 40 MG tablet 15 tablet 11    Sig: Take 1 tablet (40 mg total) by mouth daily. everyother day

## 2012-05-08 ENCOUNTER — Ambulatory Visit (INDEPENDENT_AMBULATORY_CARE_PROVIDER_SITE_OTHER): Payer: Medicare Other | Admitting: Cardiology

## 2012-05-08 ENCOUNTER — Encounter: Payer: Self-pay | Admitting: Cardiology

## 2012-05-08 VITALS — BP 140/82 | HR 62 | Ht 70.0 in | Wt 187.0 lb

## 2012-05-08 DIAGNOSIS — E7800 Pure hypercholesterolemia, unspecified: Secondary | ICD-10-CM

## 2012-05-08 DIAGNOSIS — I2589 Other forms of chronic ischemic heart disease: Secondary | ICD-10-CM

## 2012-05-08 DIAGNOSIS — I2510 Atherosclerotic heart disease of native coronary artery without angina pectoris: Secondary | ICD-10-CM

## 2012-05-08 DIAGNOSIS — I255 Ischemic cardiomyopathy: Secondary | ICD-10-CM

## 2012-05-08 LAB — CBC WITH DIFFERENTIAL/PLATELET
Basophils Absolute: 0 10*3/uL (ref 0.0–0.1)
Eosinophils Absolute: 0.2 10*3/uL (ref 0.0–0.7)
Eosinophils Relative: 2.7 % (ref 0.0–5.0)
HCT: 42 % (ref 39.0–52.0)
Lymphs Abs: 1.4 10*3/uL (ref 0.7–4.0)
MCHC: 33 g/dL (ref 30.0–36.0)
MCV: 94.4 fl (ref 78.0–100.0)
Monocytes Absolute: 0.8 10*3/uL (ref 0.1–1.0)
Neutrophils Relative %: 57.5 % (ref 43.0–77.0)
Platelets: 161 10*3/uL (ref 150.0–400.0)
RDW: 13.6 % (ref 11.5–14.6)
WBC: 5.7 10*3/uL (ref 4.5–10.5)

## 2012-05-08 MED ORDER — CLOPIDOGREL BISULFATE 75 MG PO TABS: 75.0000 mg | ORAL_TABLET | Freq: Every day | ORAL | Status: DC

## 2012-05-08 NOTE — Assessment & Plan Note (Signed)
Last LDL was ok.

## 2012-05-08 NOTE — Assessment & Plan Note (Addendum)
Stable. See my notes.  Patient would like to lighten up on his antiplatelet treatment.  We will switch to plavix at the end of the month.  I have been very specific with my instructions not to take both and to stop Effient, and make sure there is not a period of stopping.  The impact at seven months should not be problematic.  Notably, he does have two fairly sizable leg bruises.  See my prior notes regarding therapy for his CAD.

## 2012-05-08 NOTE — Patient Instructions (Addendum)
Your physician recommends that you schedule a follow-up appointment in: 2 MONTHS with Dr Riley Kill  Your physician recommends that you have lab work today: CBC  Your physician has recommended you make the following change in your medication: When you finish your current supply of Effient please do not refill this medication--please take prescription for Plavix 75mg  once a day to the pharmacy and start this medication

## 2012-05-08 NOTE — Progress Notes (Signed)
HPI:  The patient is in for followup today.  He denies any major problems. His biggest complaint is that of excessive bruising. He is to moderate size bruises on the right leg, when the arm. He works on cars regularly, and he says it takes very little to create a significant bruise. Notably, the patient is out now more than 6 months from his primary intervention. He and I extensively discussed the issues with regard to the use of prasugrel and clopidogrel, and underwent a details with him about the switches but can be made.  He said he would like to light not on this therapy, and I also reviewed the need for at least one year of treatment    Current Outpatient Prescriptions  Medication Sig Dispense Refill  . acetaminophen (TYLENOL) 325 MG tablet Take 650 mg by mouth as needed.       Marland Kitchen aspirin 81 MG chewable tablet Chew 1 tablet (81 mg total) by mouth daily.      Marland Kitchen atorvastatin (LIPITOR) 80 MG tablet Take 1 tablet (80 mg total) by mouth daily.  30 tablet  11  . B Complex-C-Folic Acid (STRESS FORMULA PO) Take by mouth 2 (two) times daily.      [name] 580 MG CAPS Take by mouth 2 (two) times daily.      . Garlic 1250 MG TABS Take 1,250 mg by mouth daily.      Marland Kitchen lisinopril (ZESTRIL) 5 MG tablet Take 1 tablet (5 mg total) by mouth daily.  30 tablet  6  . metoprolol tartrate (LOPRESSOR) 25 MG tablet Take 25 mg by mouth 2 (two) times daily.      . nitroGLYCERIN (NITROSTAT) 0.4 MG SL tablet Place 1 tablet (0.4 mg total) under the tongue every 5 (five) minutes as needed for chest pain.  25 tablet  3  . NON FORMULARY Marine -d3, 1 tablet twice daily      . pantoprazole (PROTONIX) 40 MG tablet Take 1 tablet (40 mg total) by mouth daily. everyother day  15 tablet  11  . prasugrel (EFFIENT) 10 MG TABS Take 1 tablet (10 mg total) by mouth daily.  30 tablet  6  . vitamin C (ASCORBIC ACID) 500 MG tablet Take 500 mg by mouth daily.         No Known Allergies  Past Medical History  Diagnosis Date  .  Coronary artery disease     inf. STEMI 12/12:  LHC 10/29/11: Mid left main 20% with diffuse 20-30%, LAD calcified and diffusely diseased with proximal 90%, mid 80-90%; circumflex okay; mid RCA stent 50-60% ISR, occluded after a large PDA.  PCI: Promus DES x2 (one DES tx ISR of old stent) and distal vessel balloon angioplasty.  Given the high-grade LAD and diagonal disease, CABG rec for LAD - pt declined  . GERD (gastroesophageal reflux disease)   . HLD (hyperlipidemia)     Past Surgical History  Procedure Date  . Angioplasty   . Coronary angioplasty     1980 [facility]    Family History  Problem Relation Age of Onset  . Coronary artery disease Brother     CABG  . Heart disease Brother     History   Social History  . Marital Status: Married    Spouse Name: N/A    Number of Children: N/A  . Years of Education: N/A   Occupational History  . Retired    Social History Main Topics  . Smoking status:  Former Smoker  . Smokeless tobacco: Former Neurosurgeon    Quit date: 10/28/1981  . Alcohol Use: 3.5 oz/week    7 drink(s) per week  . Drug Use: 7 per week  . Sexually Active: Yes   Other Topics Concern  . Not on file   Social History Narrative   Married    ROS: Please see the HPI.  All other systems reviewed and negative.  PHYSICAL EXAM:  BP 140/82  Pulse 62  Ht 5\' 10"  (1.778 m)  Wt 187 lb (84.823 kg)  BMI 26.83 kg/m2  General: Well developed, well nourished, in no acute distress. Head:  Normocephalic and atraumatic. Neck: no JVD Lungs: Clear to auscultation and percussion. Heart: Normal S1 and S2.  No murmur, rubs or gallops.  Pulses: Pulses normal in all 4 extremities. Extremities: No clubbing or cyanosis. No edema.  Echymossis of right thigh times two, none tense, and r forearm.  Neurologic: Alert and oriented x 3.  EKG:  NSR.  Nonspecific T wave flattening.    ASSESSMENT AND PLAN:

## 2012-05-20 ENCOUNTER — Other Ambulatory Visit: Payer: Self-pay | Admitting: *Deleted

## 2012-05-20 MED ORDER — METOPROLOL TARTRATE 25 MG PO TABS: 25.0000 mg | ORAL_TABLET | Freq: Two times a day (BID) | ORAL | Status: DC

## 2012-05-20 NOTE — Telephone Encounter (Signed)
Refilled metoprolol 

## 2012-05-26 NOTE — Assessment & Plan Note (Signed)
Last EF was 40%.

## 2012-05-30 ENCOUNTER — Other Ambulatory Visit: Payer: Self-pay | Admitting: Cardiology

## 2012-05-30 MED ORDER — CLOPIDOGREL BISULFATE 75 MG PO TABS: 75.0000 mg | ORAL_TABLET | Freq: Every day | ORAL | Status: AC

## 2012-05-30 NOTE — Telephone Encounter (Signed)
Out of pills 

## 2012-07-03 ENCOUNTER — Ambulatory Visit (INDEPENDENT_AMBULATORY_CARE_PROVIDER_SITE_OTHER): Payer: Medicare Other | Admitting: Cardiology

## 2012-07-03 ENCOUNTER — Encounter: Payer: Self-pay | Admitting: Cardiology

## 2012-07-03 VITALS — BP 121/78 | HR 75 | Ht 70.0 in | Wt 188.1 lb

## 2012-07-03 DIAGNOSIS — I2510 Atherosclerotic heart disease of native coronary artery without angina pectoris: Secondary | ICD-10-CM

## 2012-07-03 NOTE — Patient Instructions (Signed)
Your physician recommends that you continue on your current medications as directed. Please refer to the Current Medication list given to you today.  Your physician recommends that you schedule a follow-up appointment in: 3 MONTHS  

## 2012-07-29 NOTE — Progress Notes (Signed)
HPI:  The patient is in for followup visit. From a clinical standpoint he is stable. 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 The business environment stressful for him. He spent a lot of time today ventilating about his son. From a purely medical perspective, he appears to be doing well. He seems to be bruising less. He works on cars regularly he was previously on [employer].  We brought him in today to make sure he was stable from the standpoint of his medications.    Current Outpatient Prescriptions  Medication Sig Dispense Refill  . acetaminophen (TYLENOL) 325 MG tablet Take 650 mg by mouth as needed.       Marland Kitchen aspirin 81 MG chewable tablet Chew 1 tablet (81 mg total) by mouth daily.      Marland Kitchen atorvastatin (LIPITOR) 80 MG tablet Take 1 tablet (80 mg total) by mouth daily.  30 tablet  11  . B Complex-C-Folic Acid (STRESS FORMULA PO) Take by mouth 2 (two) times daily.      [name] 580 MG CAPS Take by mouth 2 (two) times daily.      . clopidogrel (PLAVIX) 75 MG tablet Take 1 tablet (75 mg total) by mouth daily.  30 tablet  11  . Garlic 1250 MG TABS Take 1,250 mg by mouth daily.      Marland Kitchen lisinopril (ZESTRIL) 5 MG tablet Take 1 tablet (5 mg total) by mouth daily.  30 tablet  6  . metoprolol tartrate (LOPRESSOR) 25 MG tablet Take 1 tablet (25 mg total) by mouth 2 (two) times daily.  60 tablet  11  . nitroGLYCERIN (NITROSTAT) 0.4 MG SL tablet Place 1 tablet (0.4 mg total) under the tongue every 5 (five) minutes as needed for chest pain.  25 tablet  3  . NON FORMULARY Marine -d3, 1 tablet twice daily      . pantoprazole (PROTONIX) 40 MG tablet Take 1 tablet (40 mg total) by mouth daily. everyother day  15 tablet  11  . vitamin C (ASCORBIC ACID) 500 MG tablet Take 500 mg by mouth daily.         No Known Allergies  Past Medical History  Diagnosis Date  . Coronary artery disease     inf. STEMI 12/12:  LHC 10/29/11: Mid left main 20% with diffuse 20-30%, LAD calcified  and diffusely diseased with proximal 90%, mid 80-90%; circumflex okay; mid RCA stent 50-60% ISR, occluded after a large PDA.  PCI: Promus DES x2 (one DES tx ISR of old stent) and distal vessel balloon angioplasty.  Given the high-grade LAD and diagonal disease, CABG rec for LAD - pt declined  . GERD (gastroesophageal reflux disease)   . HLD (hyperlipidemia)     Past Surgical History  Procedure Date  . Angioplasty   . Coronary angioplasty     1980 [facility]    Family History  Problem Relation Age of Onset  . Coronary artery disease Brother     CABG  . Heart disease Brother     History   Social History  . Marital Status: Married    Spouse Name: N/A    Number of Children: N/A  . Years of Education: N/A   Occupational History  . Retired    Social History Main Topics  . Smoking status: Former Games developer  . Smokeless tobacco: Former Neurosurgeon    Quit date: 10/28/1981  . Alcohol Use: 3.5 oz/week    7 drink(s)  per week  . Drug Use: 7 per week  . Sexually Active: Yes   Other Topics Concern  . Not on file   Social History Narrative   Married    ROS: Please see the HPI.  All other systems reviewed and negative.  PHYSICAL EXAM:  BP 121/78  Pulse 75  Ht 5\' 10"  (1.778 m)  Wt 188 lb 1.9 oz (85.331 kg)  BMI 26.99 kg/m2  General: Well developed, well nourished, in no acute distress. Head:  Normocephalic and atraumatic. Neck: no JVD Lungs: Clear to auscultation and percussion. Heart: Normal S1 and S2.  No murmur, rubs or gallops.  Abdomen:  Normal bowel sounds; soft; non tender; no organomegaly Pulses: Pulses normal in all 4 extremities. Extremities: No clubbing or cyanosis. No edema. Neurologic: Alert and oriented x 3.  EKG:  Not done ASSESSMENT AND PLAN:  1.  coronary artery disease-the patient declined bypass and has multivessel disease. He's been managed medically. He remains on dual antiplatelet therapy as a result of this.  Continue current regimen. 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 Hyperlipidemia on lipid-lowering therapy near target 4. Borderline glucose elevation  We will continue to follow patient closely in the cardiology clinic. Any change in status should be reported promptly.

## 2012-07-29 NOTE — Assessment & Plan Note (Signed)
See assessment.

## 2012-07-30 ENCOUNTER — Other Ambulatory Visit: Payer: Self-pay | Admitting: Cardiology

## 2012-07-30 MED ORDER — METOPROLOL TARTRATE 25 MG PO TABS: 25.0000 mg | ORAL_TABLET | Freq: Two times a day (BID) | ORAL | Status: DC

## 2012-10-08 ENCOUNTER — Encounter: Payer: Self-pay | Admitting: Cardiology

## 2012-10-08 ENCOUNTER — Ambulatory Visit (INDEPENDENT_AMBULATORY_CARE_PROVIDER_SITE_OTHER): Payer: Medicare Other | Admitting: Cardiology

## 2012-10-08 VITALS — BP 141/93 | HR 62 | Ht 70.0 in | Wt 190.0 lb

## 2012-10-08 DIAGNOSIS — I2510 Atherosclerotic heart disease of native coronary artery without angina pectoris: Secondary | ICD-10-CM

## 2012-10-08 DIAGNOSIS — E7800 Pure hypercholesterolemia, unspecified: Secondary | ICD-10-CM

## 2012-10-08 NOTE — Assessment & Plan Note (Signed)
He will get a repeat lipid and liver when I see him in a few months.  He will come in fasting.

## 2012-10-08 NOTE — Patient Instructions (Addendum)
Your physician recommends that you schedule a follow-up appointment in: February 2014--Do Not Eat Breakfast the morning of appointment  Your physician recommends that you continue on your current medications as directed. Please refer to the Current Medication list given to you today.

## 2012-10-08 NOTE — Assessment & Plan Note (Signed)
The patient at complex intervention of the right coronary artery during the setting of acute coronary syndrome. We  recommended that he consider revascularization surgery, but he has not been really willing to consider  that. His desire is to be continued to be treated medically.  I reviewed his anatomy with him today.

## 2012-10-08 NOTE — Progress Notes (Signed)
HPI:  Patient seen today in a followup visit. From a cardiac standpoint he remained stable he's not having chest pain or shortness of breath. He is a very lengthy discussion about his coronary anatomy, and also his feelings about coronary artery bypass surgery. Certainly 21 of this idea, carrying many of the ideas he was given when he had his angioplasty at [facility] in [facility].  He denies exertional symptoms.  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    Current Outpatient Prescriptions  Medication Sig Dispense Refill  . acetaminophen (TYLENOL) 325 MG tablet Take 650 mg by mouth as needed.       Marland Kitchen aspirin 81 MG chewable tablet Chew 1 tablet (81 mg total) by mouth daily.      Marland Kitchen atorvastatin (LIPITOR) 80 MG tablet Take 1 tablet (80 mg total) by mouth daily.  30 tablet  11  . B Complex-C-Folic Acid (STRESS FORMULA PO) Take by mouth 2 (two) times daily.      [name] 580 MG CAPS Take by mouth 2 (two) times daily.      . clopidogrel (PLAVIX) 75 MG tablet Take 1 tablet (75 mg total) by mouth daily.  30 tablet  11  . Garlic 1250 MG TABS Take 1,250 mg by mouth daily.      Marland Kitchen lisinopril (ZESTRIL) 5 MG tablet Take 1 tablet (5 mg total) by mouth daily.  30 tablet  6  . metoprolol tartrate (LOPRESSOR) 25 MG tablet Take 1 tablet (25 mg total) by mouth 2 (two) times daily.  60 tablet  11  . nitroGLYCERIN (NITROSTAT) 0.4 MG SL tablet Place 1 tablet (0.4 mg total) under the tongue every 5 (five) minutes as needed for chest pain.  25 tablet  3  . NON FORMULARY Marine -d3, 1 tablet twice daily      . pantoprazole (PROTONIX) 40 MG tablet Take 1 tablet (40 mg total) by mouth daily. everyother day  15 tablet  11  . vitamin C (ASCORBIC ACID) 500 MG tablet Take 500 mg by mouth daily.         No Known Allergies  Past Medical History  Diagnosis Date  . Coronary artery disease     inf. STEMI 12/12:  LHC 10/29/11: Mid left main 20% with diffuse 20-30%, LAD calcified and diffusely diseased with  proximal 90%, mid 80-90%; circumflex okay; mid RCA stent 50-60% ISR, occluded after a large PDA.  PCI: Promus DES x2 (one DES tx ISR of old stent) and distal vessel balloon angioplasty.  Given the high-grade LAD and diagonal disease, CABG rec for LAD - pt declined  . GERD (gastroesophageal reflux disease)   . HLD (hyperlipidemia)     Past Surgical History  Procedure Date  . Angioplasty   . Coronary angioplasty     1980 [facility]    Family History  Problem Relation Age of Onset  . Coronary artery disease Brother     CABG  . Heart disease Brother     History   Social History  . Marital Status: Married    Spouse Name: N/A    Number of Children: N/A  . Years of Education: N/A   Occupational History  . Retired    Social History Main Topics  . Smoking status: Former Games developer  . Smokeless tobacco: Former Neurosurgeon    Quit date: 10/28/1981  . Alcohol Use: 3.5 oz/week    7 drink(s) per week  . Drug Use: 7 per  week  . Sexually Active: Yes   Other Topics Concern  . Not on file   Social History Narrative   Married    ROS: Please see the HPI.  All other systems reviewed and negative.  PHYSICAL EXAM:  BP 141/93  Pulse 62  Ht 5\' 10"  (1.778 m)  Wt 190 lb (86.183 kg)  BMI 27.26 kg/m2  SpO2 99%  General: Well developed, well nourished, in no acute distress. Head:  Normocephalic and atraumatic. Neck: no JVD Lungs: Clear to auscultation and percussion. Heart: Normal S1 and S2.  No murmur, rubs or gallops.  Abdomen:  Normal bowel sounds; soft; non tender; no organomegaly Pulses: Pulses normal in all 4 extremities. Extremities: No clubbing or cyanosis. No edema. Neurologic: Alert and oriented x 3.  EKG:  NSR.  Inferolateral T changes, somewhat similar to early last year.   ASSESSMENT AND PLAN:

## 2012-10-24 ENCOUNTER — Other Ambulatory Visit: Payer: Self-pay | Admitting: *Deleted

## 2012-10-24 MED ORDER — LISINOPRIL 5 MG PO TABS: 5.0000 mg | ORAL_TABLET | Freq: Every day | ORAL | Status: DC

## 2012-12-26 ENCOUNTER — Ambulatory Visit: Payer: Medicare Other | Admitting: Cardiology

## 2013-01-01 ENCOUNTER — Ambulatory Visit: Payer: Medicare Other | Admitting: Cardiology

## 2013-01-06 ENCOUNTER — Other Ambulatory Visit: Payer: Self-pay | Admitting: *Deleted

## 2013-04-24 IMAGING — CR DG CHEST 1V PORT SAME DAY
1 series · 1 of 1 positions shown · non-contrast
Comparison: None

CLINICAL DATA: Myocardial infarction.

PORTABLE CHEST - 1 VIEW SAME DAY

[view not recorded]
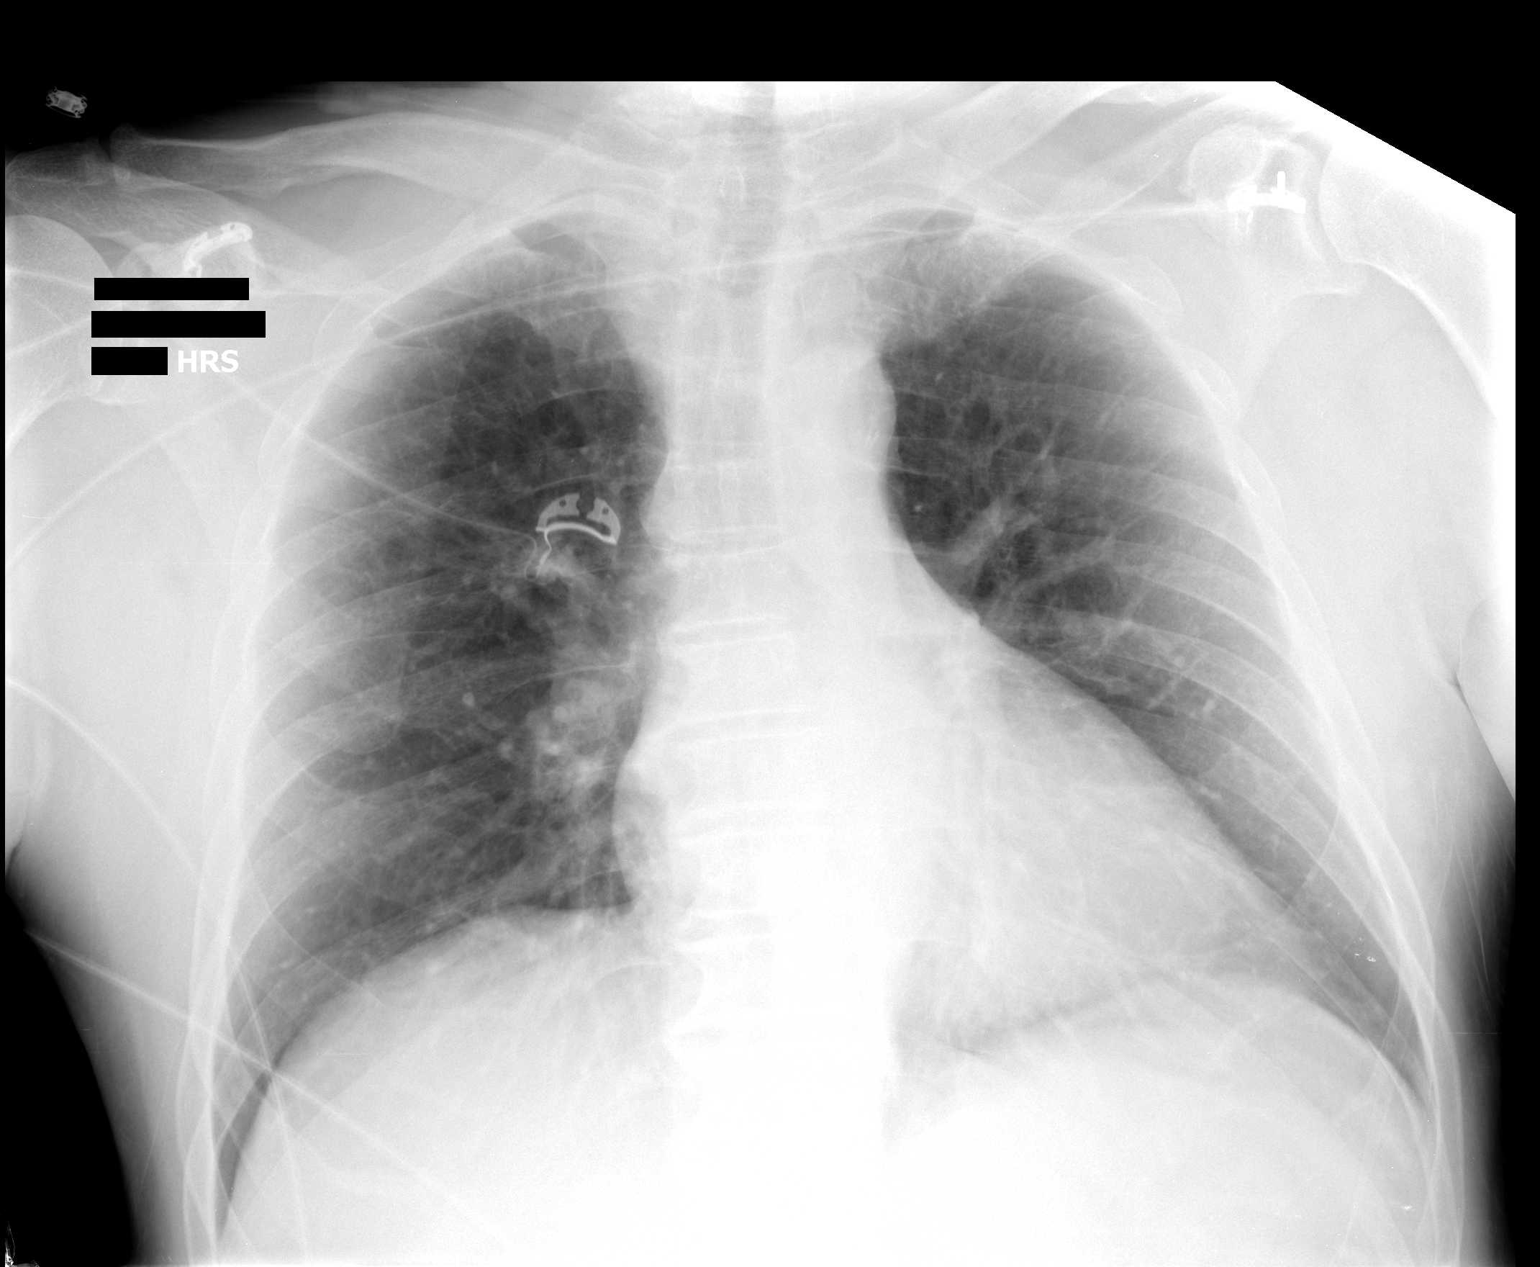

[1 of 1 positions shown; findings below may reference images not displayed]

FINDINGS: The heart is normal in size given the AP projection.  The
mediastinal and hilar contours are normal.  The lungs are clear.
No pleural effusion.  The bony thorax is intact.
IMPRESSION: No acute cardiopulmonary findings.

## 2013-04-26 IMAGING — CR DG CHEST 2V
2 series · 2 of 2 positions shown · non-contrast
Comparison: 10/29/2011

CLINICAL DATA: Cough.  Gastroesophageal reflux.  Coronary artery
disease.  Hypercholesterolemia.

CHEST - 2 VIEW

[w chest pa]
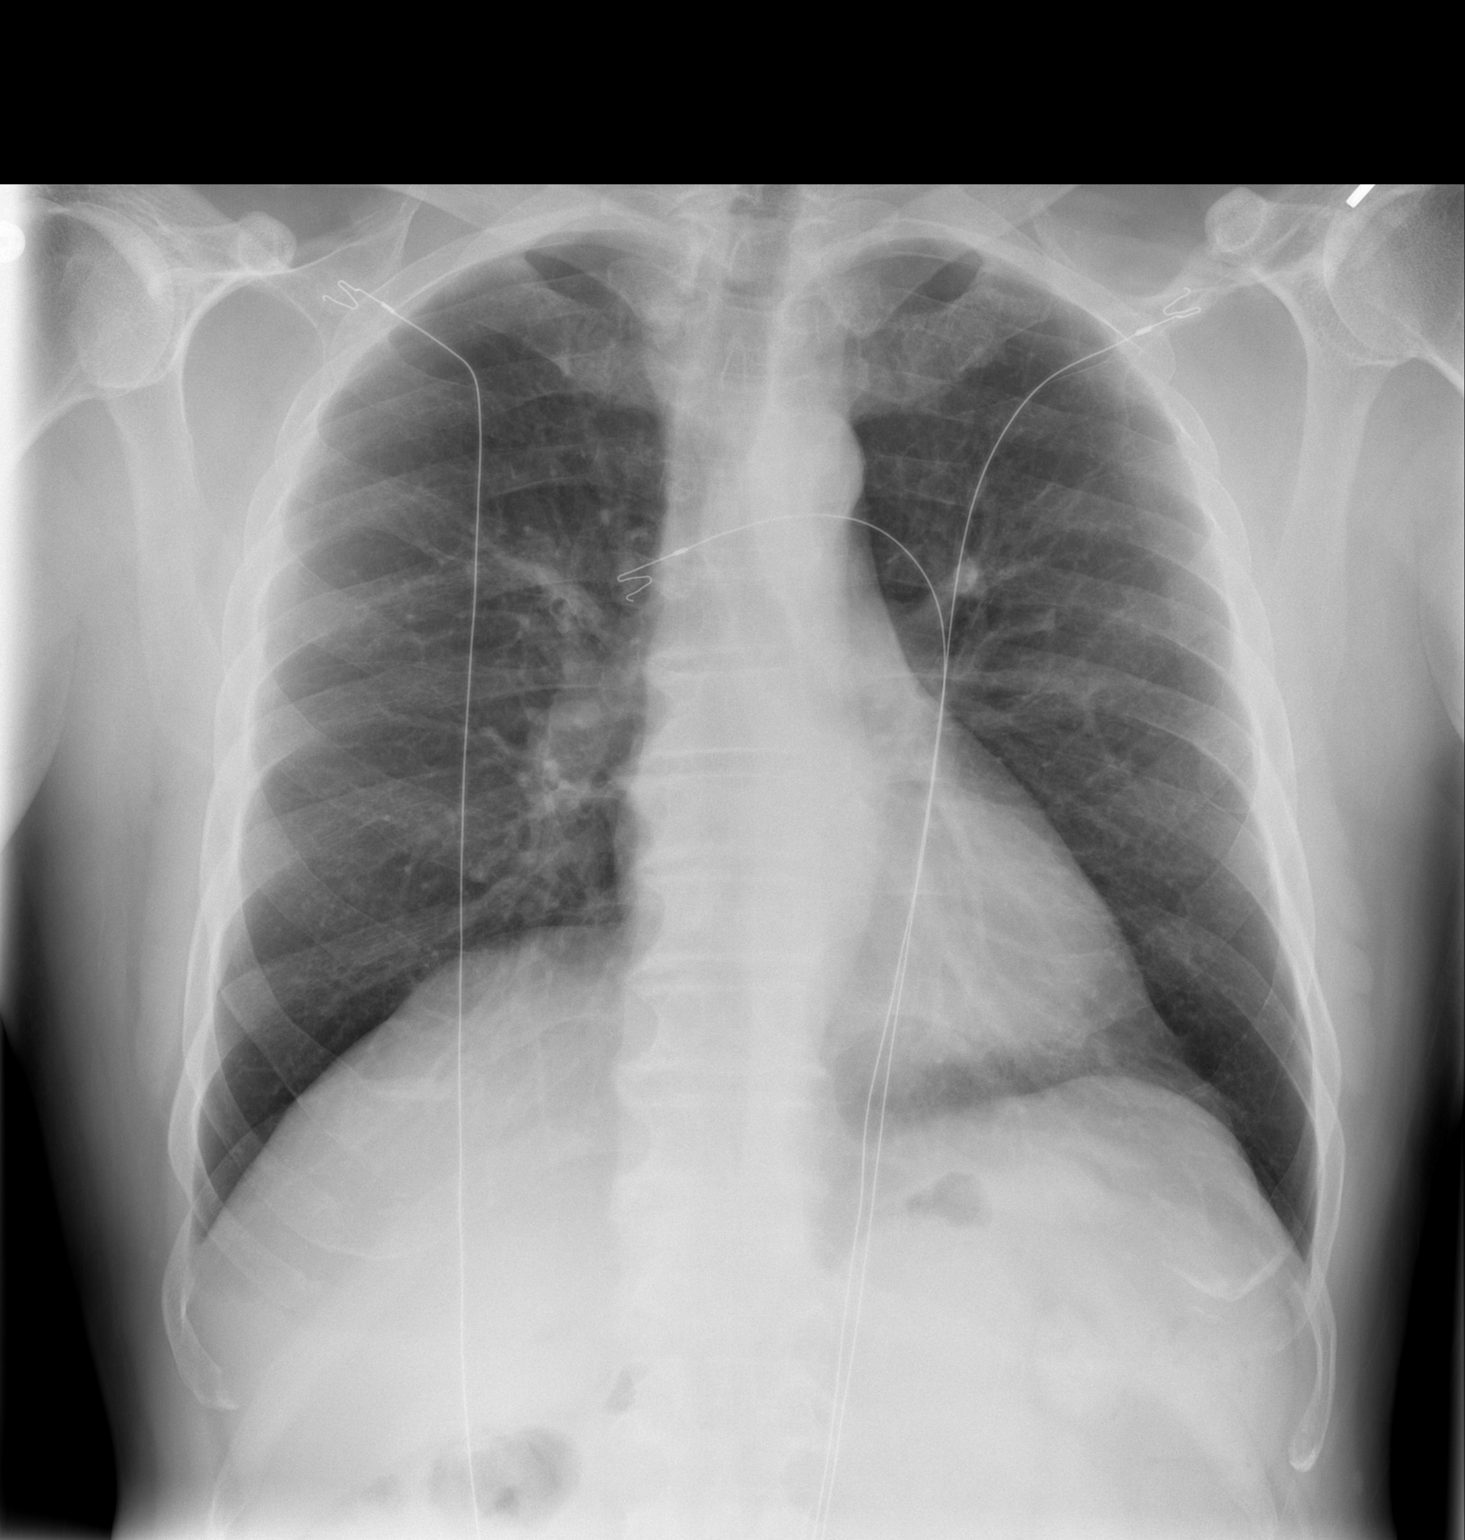

[w chest lat]
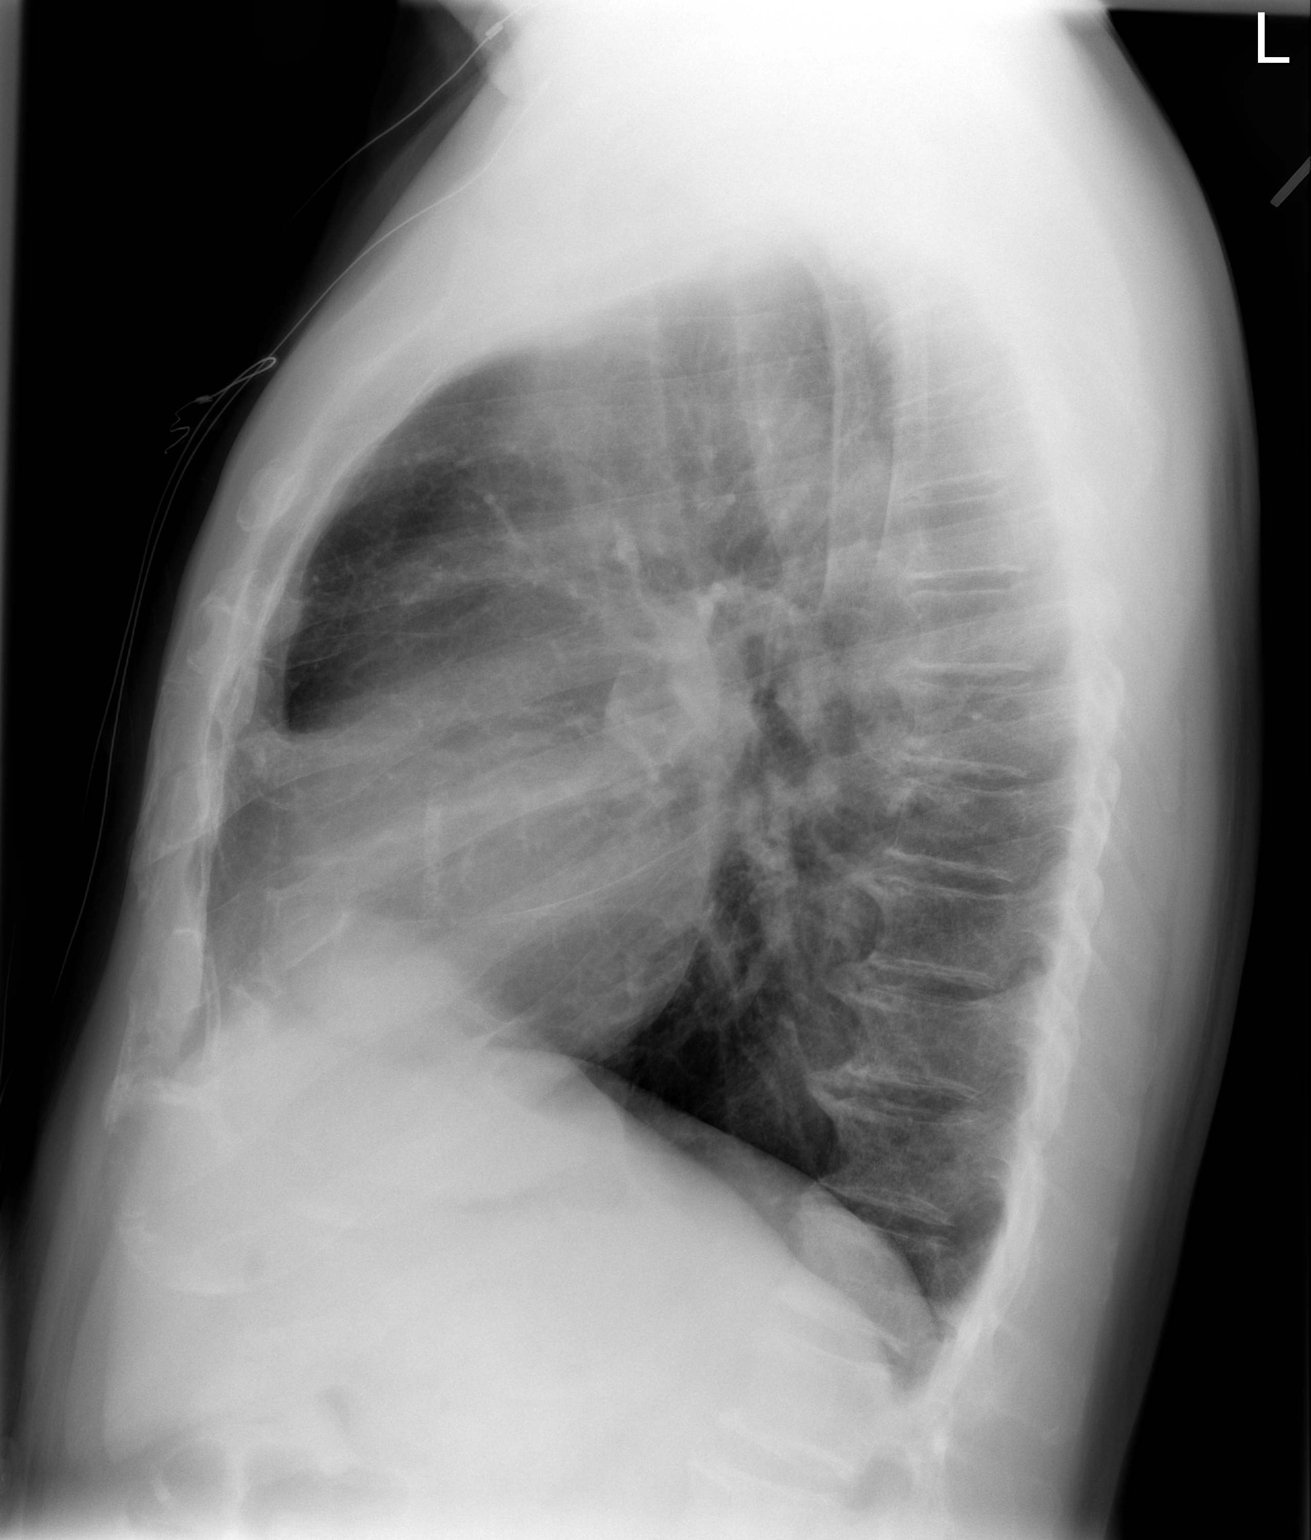

[2 of 2 positions shown; findings below may reference images not displayed]

FINDINGS: Heart size remains normal.  Both lungs are clear.  No
evidence of pleural effusion.  No mass or lymphadenopathy
identified.  Coronary artery calcification versus prior stent
placement noted on the lateral projection.
IMPRESSION: No active cardiopulmonary disease.

## 2014-10-21 ENCOUNTER — Encounter (HOSPITAL_COMMUNITY): Payer: Self-pay | Admitting: Cardiology

## 2018-12-11 ENCOUNTER — Telehealth (HOSPITAL_COMMUNITY): Payer: Self-pay | Admitting: *Deleted

## 2018-12-11 NOTE — Telephone Encounter (Signed)
Received office referral from Dr. Rosemary Holms for this patient to participate in Cardiac Rehab s/p 10/2018  NSTEMI and CABG x 4 with [place] while on vacation in [place].  Pt has completed his surgical follow up in [place].  Pt office note from 12/06/18 for establishing care completed. Pt is making the expected progress in recovery.  Pt appropriate for scheduling for cardiac rehab.  Will forward to support staff for Insurance verification/eligibility, create electronic referral and scheduling with pt  consent. Kenneth Booth, BSN Cardiac and Emergency planning/management officer

## 2018-12-24 ENCOUNTER — Other Ambulatory Visit: Payer: Self-pay | Admitting: Cardiology

## 2018-12-24 DIAGNOSIS — I2510 Atherosclerotic heart disease of native coronary artery without angina pectoris: Secondary | ICD-10-CM

## 2019-01-08 ENCOUNTER — Telehealth (HOSPITAL_COMMUNITY): Payer: Self-pay

## 2019-01-08 NOTE — Telephone Encounter (Signed)
Called pt to see if pt wanted to participate in cardiac rehab pt declined. Tempie Donning. Support Rep II  Closed referral

## 2019-01-09 ENCOUNTER — Other Ambulatory Visit (HOSPITAL_COMMUNITY): Payer: Self-pay | Admitting: Cardiology

## 2019-01-10 LAB — COMPREHENSIVE METABOLIC PANEL
A/G RATIO: 1.6 (ref 1.2–2.2)
ALT: 25 IU/L (ref 0–44)
AST: 26 IU/L (ref 0–40)
Albumin: 4.2 g/dL (ref 3.7–4.7)
Alkaline Phosphatase: 39 IU/L (ref 39–117)
BILIRUBIN TOTAL: 0.5 mg/dL (ref 0.0–1.2)
BUN/Creatinine Ratio: 12 (ref 10–24)
BUN: 10 mg/dL (ref 8–27)
CO2: 24 mmol/L (ref 20–29)
Calcium: 9.2 mg/dL (ref 8.6–10.2)
Chloride: 106 mmol/L (ref 96–106)
Creatinine, Ser: 0.86 mg/dL (ref 0.76–1.27)
GFR calc Af Amer: 98 mL/min/{1.73_m2} (ref >59)
GFR calc non Af Amer: 85 mL/min/{1.73_m2} (ref >59)
Globulin, Total: 2.7 g/dL (ref 1.5–4.5)
Glucose: 105 mg/dL — ABNORMAL HIGH (ref 65–99)
Potassium: 4.7 mmol/L (ref 3.5–5.2)
SODIUM: 145 mmol/L — AB (ref 134–144)
Total Protein: 6.9 g/dL (ref 6.0–8.5)

## 2019-01-10 LAB — CBC WITH DIFFERENTIAL/PLATELET
Basophils Absolute: 0 10*3/uL (ref 0.0–0.2)
Basos: 1 %
EOS (ABSOLUTE): 0.2 10*3/uL (ref 0.0–0.4)
EOS: 4 %
HEMATOCRIT: 40.1 % (ref 37.5–51.0)
Hemoglobin: 13.4 g/dL (ref 13.0–17.7)
Immature Grans (Abs): 0 10*3/uL (ref 0.0–0.1)
Immature Granulocytes: 0 %
LYMPHS ABS: 1.2 10*3/uL (ref 0.7–3.1)
Lymphs: 22 %
MCH: 29.1 pg (ref 26.6–33.0)
MCHC: 33.4 g/dL (ref 31.5–35.7)
MCV: 87 fL (ref 79–97)
MONOS ABS: 0.8 10*3/uL (ref 0.1–0.9)
Monocytes: 15 %
Neutrophils Absolute: 3.2 10*3/uL (ref 1.4–7.0)
Neutrophils: 58 %
Platelets: 196 10*3/uL (ref 150–450)
RBC: 4.61 x10E6/uL (ref 4.14–5.80)
RDW: 12.8 % (ref 11.6–15.4)
WBC: 5.5 10*3/uL (ref 3.4–10.8)

## 2019-01-10 LAB — LIPID PANEL W/O CHOL/HDL RATIO
CHOLESTEROL TOTAL: 155 mg/dL (ref 100–199)
HDL: 50 mg/dL (ref >39)
LDL Calculated: 86 mg/dL (ref 0–99)
Triglycerides: 95 mg/dL (ref 0–149)
VLDL CHOLESTEROL CAL: 19 mg/dL (ref 5–40)

## 2019-01-13 ENCOUNTER — Ambulatory Visit: Payer: Medicare Other

## 2019-01-13 DIAGNOSIS — I2510 Atherosclerotic heart disease of native coronary artery without angina pectoris: Secondary | ICD-10-CM | POA: Diagnosis not present

## 2019-01-18 NOTE — Progress Notes (Signed)
Patient is here for follow up visit.  Subjective:   Kenneth Booth, male    DOB: 26-Sep-1941, 78 y.o.   MRN: [id number]   Chief Complaint  Patient presents with  . Coronary Artery Disease  . Hyperlipidemia  . Congestive Heart Failure  . Follow-up    echo,last EKG 12/06/18     HPI  78 y/o Caucasian male with CAD, multiple prior PCI's, recent NSTEMI and CABG X4 in 10/2018 in [place], EF 45%, controlled hypertension, hyperlipidemia.  At his establish care visit in 12/06/2018, I recommended continuing aspirin/plavix till 10/2018, metoprolol succinate 1/2 of 25 mg daily, lisinopril 2.5 mg daily, crestor 20 mg daily.  Echocardiogram showed reduced LVEF 30-35%.   Patient has not started his baseline physical activity since his bypass surgery.  With limited activity, he does not have any significant angina, dyspnea symptoms.  He had stopped Crestor, as he was unsure if he needs to continue this.  He reports random episodes of left upper chest/shoulder pain that occurs unrelated to exertion.   Past Medical History:  Diagnosis Date  . Coronary artery disease    inf. STEMI 12/12:  LHC 10/29/11: Mid left main 20% with diffuse 20-30%, LAD calcified and diffusely diseased with proximal 90%, mid 80-90%; circumflex okay; mid RCA stent 50-60% ISR, occluded after a large PDA.  PCI: Promus DES x2 (one DES tx ISR of old stent) and distal vessel balloon angioplasty.  Given the high-grade LAD and diagonal disease, CABG rec for LAD - pt declined  . GERD (gastroesophageal reflux disease)   . HLD (hyperlipidemia)      Past Surgical History:  Procedure Laterality Date  . ANGIOPLASTY    . CORONARY ANGIOPLASTY     1980 [facility]  . LEFT HEART CATHETERIZATION WITH CORONARY ANGIOGRAM N/A 10/29/2011   Procedure: LEFT HEART CATHETERIZATION WITH CORONARY ANGIOGRAM;  Surgeon: Herby Abraham, MD;  [facility];  Service: Cardiovascular;  Laterality: N/A;  . PERCUTANEOUS CORONARY STENT  INTERVENTION (PCI-S) N/A 10/29/2011   Procedure: PERCUTANEOUS CORONARY STENT INTERVENTION (PCI-S);  Surgeon: Herby Abraham, MD;  Location: [facility] CATH LAB;  Service: Cardiovascular;  Laterality: N/A;     Social History   Socioeconomic History  . Marital status: Married    Spouse name: Not on file  . Number of children: Not on file  . Years of education: Not on file  . Highest education level: Not on file  Occupational History  . Occupation: Retired  Engineer, production  . Financial resource strain: Not on file  . Food insecurity:    Worry: Not on file    Inability: Not on file  . Transportation needs:    Medical: Not on file    Non-medical: Not on file  Tobacco Use  . Smoking status: Former Games developer  . Smokeless tobacco: Former Neurosurgeon    Quit date: 10/28/1981  Substance and Sexual Activity  . Alcohol use: Yes    Alcohol/week: 7.0 standard drinks    Types: 7 drink(s) per week  . Drug use: Yes    Frequency: 7.0 times per week  . Sexual activity: Yes  Lifestyle  . Physical activity:    Days per week: Not on file    Minutes per session: Not on file  . Stress: Not on file  Relationships  . Social connections:    Talks on phone: Not on file    Gets together: Not on file    Attends religious service: Not on file  Active member of club or organization: Not on file    Attends meetings of clubs or organizations: Not on file    Relationship status: Not on file  . Intimate partner violence:    Fear of current or ex partner: Not on file    Emotionally abused: Not on file    Physically abused: Not on file    Forced sexual activity: Not on file  Other Topics Concern  . Not on file  Social History Narrative   Married           Current Outpatient Medications on File Prior to Visit  Medication Sig Dispense Refill  . acetaminophen (TYLENOL) 325 MG tablet Take 650 mg by mouth as needed.     Marland Kitchen aspirin EC 81 MG tablet Take 81 mg by mouth daily.    . B Complex-C-Folic Acid (STRESS  FORMULA PO) Take by mouth daily.     . clopidogrel (PLAVIX) 75 MG tablet Take 75 mg by mouth daily.    . Garlic 1250 MG TABS Take 1,250 mg by mouth daily.    . Multiple Vitamin (MULTIVITAMIN) tablet Take 1 tablet by mouth daily.    . nitroGLYCERIN (NITROSTAT) 0.4 MG SL tablet Place 1 tablet (0.4 mg total) under the tongue every 5 (five) minutes as needed for chest pain. 25 tablet 3  . omeprazole (PRILOSEC) 10 MG capsule Take 10 mg by mouth daily.    . vitamin C (ASCORBIC ACID) 500 MG tablet Take 500 mg by mouth daily.     [name] 580 MG CAPS Take by mouth 2 (two) times daily.    . NON FORMULARY Marine -d3, 1 tablet twice daily    . pantoprazole (PROTONIX) 40 MG tablet Take 1 tablet (40 mg total) by mouth daily. everyother day (Patient not taking: Reported on 01/20/2019) 15 tablet 11   No current facility-administered medications on file prior to visit.     Cardiovascular studies:   Echocardiogram 01/13/2019: Left ventricle cavity is normal in size. Mild concentric hypertrophy of the left ventricle. Moderate decrease in global wall motion. Abnormal septal wall motion due to post-operative coronary artery bypass graft. Doppler evidence of grade I (impaired) diastolic dysfunction, normal LAP. Calculated EF 30%. Left atrial cavity is mildly dilated. IVC is dilated with respiratory variation. Estimated RA pressure 8 mmHg.  Labs reviewed.  Review of Systems  Constitution: Negative for decreased appetite, malaise/fatigue, weight gain and weight loss.  HENT: Negative for congestion.   Eyes: Negative for visual disturbance.  Cardiovascular: Negative for chest pain, dyspnea on exertion, leg swelling, palpitations and syncope.  Respiratory: Negative for shortness of breath.   Endocrine: Negative for cold intolerance.  Hematologic/Lymphatic: Does not bruise/bleed easily.  Skin: Negative for itching and rash.  Musculoskeletal: Negative for myalgias.  Gastrointestinal: Negative for abdominal  pain, nausea and vomiting.  Genitourinary: Negative for dysuria.  Neurological: Negative for dizziness and weakness.  Psychiatric/Behavioral: The patient is not nervous/anxious.   All other systems reviewed and are negative.      Objective:    Vitals:   01/20/19 1203  BP: 127/82  Pulse: 83  SpO2: 96%     Physical Exam  Constitutional: He is oriented to person, place, and time. He appears well-developed and well-nourished. No distress.  HENT:  Head: Normocephalic and atraumatic.  Eyes: Pupils are equal, round, and reactive to light. Conjunctivae are normal.  Neck: No JVD present.  Cardiovascular: Normal rate and regular rhythm.  Pulmonary/Chest: Effort normal and breath sounds  normal. He has no wheezes. He has no rales.  Sternotomy scar  Abdominal: Soft. Bowel sounds are normal. There is no rebound.  Musculoskeletal:        General: No edema.  Lymphadenopathy:    He has no cervical adenopathy.  Neurological: He is alert and oriented to person, place, and time. No cranial nerve deficit.  Skin: Skin is warm and dry.  Psychiatric: He has a normal mood and affect.  Nursing note and vitals reviewed.       Assessment & Recommendations:    78 y/o Caucasian male with CAD, multiple prior PCI's, recent NSTEMI and CABG X4 in 10/2018 in [place], ischemic cardiomyopathy EF 30-35%, controlled hypertension, hyperlipidemia.  1. Chronic systolic heart failure (HCC) NYHA class I-II symptoms.  EF has persisted to be low 3 months after CABG.  Stop lisinopril.  Start Entresto 24-26 mg twice daily on 01/22/2019.  Check BMP in 1 week.  Repeat visit in 2 weeks for possible up titration of Entresto to 49-51 mg twice daily. Increase metoprolol succinate to 25 mg daily. Encourage adequate hydration to avoid symptoms of lightheadedness due to hypotension. We will repeat echocardiogram in 05/14/2019.  If no improvement in EF on maximal medical therapy for heart failure, may need consideration for  ICD. Referred to cardiac rehab.  2. Coronary artery disease involving coronary bypass graft of native heart without angina pectoris Continue aspirin, Plavix at least till 10/2019. Recommend resuming Crestor at higher dose 40 mg daily.  Patient is not interested in PCSK9 inhibitor therapy.    Elder Negus, MD [facility] Pager: [phone] Office: [phone] If no answer Cell [phone]

## 2019-01-20 ENCOUNTER — Ambulatory Visit: Payer: Medicare Other | Admitting: Cardiology

## 2019-01-20 ENCOUNTER — Encounter: Payer: Self-pay | Admitting: Cardiology

## 2019-01-20 VITALS — BP 127/82 | HR 83 | Ht 69.0 in | Wt 190.7 lb

## 2019-01-20 DIAGNOSIS — I25810 Atherosclerosis of coronary artery bypass graft(s) without angina pectoris: Secondary | ICD-10-CM

## 2019-01-20 DIAGNOSIS — I5022 Chronic systolic (congestive) heart failure: Secondary | ICD-10-CM | POA: Diagnosis not present

## 2019-01-20 MED ORDER — SACUBITRIL-VALSARTAN 24-26 MG PO TABS: 1.0000 | ORAL_TABLET | Freq: Two times a day (BID) | ORAL | 0 refills | Status: DC

## 2019-01-20 MED ORDER — METOPROLOL SUCCINATE ER 25 MG PO TB24: 12.5000 mg | ORAL_TABLET | Freq: Every day | ORAL | 3 refills | Status: DC

## 2019-01-20 MED ORDER — ROSUVASTATIN CALCIUM 40 MG PO TABS: 40.0000 mg | ORAL_TABLET | Freq: Every day | ORAL | 3 refills | Status: DC

## 2019-01-20 NOTE — Patient Instructions (Addendum)
Start Entresto 24-26 mg twice daily on 03/11 Do not pick up the prescription from the pharmacy yet. I will likely increase Entresto to 49-51 mg twice daily at next visit.  Increase metoprolol succinate to 1 tab of 25 mg daily Increase Crestor to 2 tab of 20 mg daily or 1 tab on 40 mg daily.

## 2019-01-30 ENCOUNTER — Telehealth (HOSPITAL_COMMUNITY): Payer: Self-pay

## 2019-01-30 LAB — BASIC METABOLIC PANEL
BUN/Creatinine Ratio: 8 — ABNORMAL LOW (ref 10–24)
BUN: 7 mg/dL — ABNORMAL LOW (ref 8–27)
CO2: 24 mmol/L (ref 20–29)
Calcium: 9.1 mg/dL (ref 8.6–10.2)
Chloride: 102 mmol/L (ref 96–106)
Creatinine, Ser: 0.88 mg/dL (ref 0.76–1.27)
GFR calc non Af Amer: 84 mL/min/{1.73_m2} (ref >59)
GFR, EST AFRICAN AMERICAN: 97 mL/min/{1.73_m2} (ref >59)
Glucose: 102 mg/dL — ABNORMAL HIGH (ref 65–99)
Potassium: 4.8 mmol/L (ref 3.5–5.2)
Sodium: 143 mmol/L (ref 134–144)

## 2019-01-30 NOTE — Telephone Encounter (Signed)
Attempted to call patient in regards to Cardiac Rehab - to let pt know we are closed at this time due to the COVID-19 and will contact once we have resume scheduling.  °LMTCB °

## 2019-02-05 ENCOUNTER — Ambulatory Visit: Payer: Medicare Other | Admitting: Cardiology

## 2019-02-05 ENCOUNTER — Other Ambulatory Visit: Payer: Self-pay

## 2019-02-05 ENCOUNTER — Telehealth: Payer: Self-pay

## 2019-02-05 ENCOUNTER — Encounter: Payer: Self-pay | Admitting: Cardiology

## 2019-02-05 VITALS — BP 94/62 | HR 90 | Ht 70.0 in | Wt 187.7 lb

## 2019-02-05 DIAGNOSIS — I5022 Chronic systolic (congestive) heart failure: Secondary | ICD-10-CM | POA: Diagnosis not present

## 2019-02-05 DIAGNOSIS — I25810 Atherosclerosis of coronary artery bypass graft(s) without angina pectoris: Secondary | ICD-10-CM | POA: Insufficient documentation

## 2019-02-05 MED ORDER — METOPROLOL SUCCINATE ER 25 MG PO TB24: 12.5000 mg | ORAL_TABLET | Freq: Every day | ORAL | 3 refills | Status: DC

## 2019-02-05 MED ORDER — SACUBITRIL-VALSARTAN 24-26 MG PO TABS: 1.0000 | ORAL_TABLET | Freq: Two times a day (BID) | ORAL | 0 refills | Status: DC

## 2019-02-05 NOTE — Telephone Encounter (Signed)
Patient was seen in office today.  

## 2019-02-05 NOTE — Progress Notes (Signed)
Patient is here for follow up visit.  Subjective:   Kenneth Booth, male    DOB: Jul 14, 1941, 78 y.o.   MRN: [id number]   Chief Complaint  Patient presents with  . Cardiomyopathy  . Coronary Artery Disease     HPI  78 y/o Caucasian male with CAD, multiple prior PCI's, recent NSTEMI and CABG X4 in 10/2018 in [place], ischemic cardiomyopathy EF 30-35%, controlled hypertension, hyperlipidemia.  At last visit, I started her on Entresto 24-26 mg bid. BMP was reassuring. He reports occasional dizziness when he goes from bending to standing up position. He denies chest pain, shortness of breath, palpitations, leg edema, orthopnea, PND, TIA/syncope.   Past Medical History:  Diagnosis Date  . Coronary artery disease    inf. STEMI 12/12:  LHC 10/29/11: Mid left main 20% with diffuse 20-30%, LAD calcified and diffusely diseased with proximal 90%, mid 80-90%; circumflex okay; mid RCA stent 50-60% ISR, occluded after a large PDA.  PCI: Promus DES x2 (one DES tx ISR of old stent) and distal vessel balloon angioplasty.  Given the high-grade LAD and diagonal disease, CABG rec for LAD - pt declined  . GERD (gastroesophageal reflux disease)   . HLD (hyperlipidemia)      Past Surgical History:  Procedure Laterality Date  . ANGIOPLASTY    . CORONARY ANGIOPLASTY     1980 [facility]  . LEFT HEART CATHETERIZATION WITH CORONARY ANGIOGRAM N/A 10/29/2011   Procedure: LEFT HEART CATHETERIZATION WITH CORONARY ANGIOGRAM;  Surgeon: Herby Abraham, MD;  Location: [facility] CATH LAB;  Service: Cardiovascular;  Laterality: N/A;  . PERCUTANEOUS CORONARY STENT INTERVENTION (PCI-S) N/A 10/29/2011   Procedure: PERCUTANEOUS CORONARY STENT INTERVENTION (PCI-S);  Surgeon: Herby Abraham, MD;  Location: [facility] CATH LAB;  Service: Cardiovascular;  Laterality: N/A;     Social History   Socioeconomic History  . Marital status: Married    Spouse name: Not on file  . Number of children: Not on file  . Years of  education: Not on file  . Highest education level: Not on file  Occupational History  . Occupation: Retired  Engineer, production  . Financial resource strain: Not on file  . Food insecurity:    Worry: Not on file    Inability: Not on file  . Transportation needs:    Medical: Not on file    Non-medical: Not on file  Tobacco Use  . Smoking status: Former Games developer  . Smokeless tobacco: Former Neurosurgeon    Quit date: 10/28/1981  Substance and Sexual Activity  . Alcohol use: Yes    Alcohol/week: 7.0 standard drinks    Types: 7 drink(s) per week  . Drug use: Yes    Frequency: 7.0 times per week  . Sexual activity: Yes  Lifestyle  . Physical activity:    Days per week: Not on file    Minutes per session: Not on file  . Stress: Not on file  Relationships  . Social connections:    Talks on phone: Not on file    Gets together: Not on file    Attends religious service: Not on file    Active member of club or organization: Not on file    Attends meetings of clubs or organizations: Not on file    Relationship status: Not on file  . Intimate partner violence:    Fear of current or ex partner: Not on file    Emotionally abused: Not on file    Physically abused: Not on file  Forced sexual activity: Not on file  Other Topics Concern  . Not on file  Social History Narrative   Married           Current Outpatient Medications on File Prior to Visit  Medication Sig Dispense Refill  . acetaminophen (TYLENOL) 325 MG tablet Take 650 mg by mouth as needed.     Marland Kitchen aspirin EC 81 MG tablet Take 81 mg by mouth daily.    . B Complex-C-Folic Acid (STRESS FORMULA PO) Take by mouth daily.     [name] 580 MG CAPS Take by mouth 2 (two) times daily.    . clopidogrel (PLAVIX) 75 MG tablet Take 75 mg by mouth daily.    . Garlic 1250 MG TABS Take 1,250 mg by mouth daily.    . metoprolol succinate (TOPROL-XL) 25 MG 24 hr tablet Take 0.5 tablets (12.5 mg total) by mouth daily. 90 tablet 3  . Multiple  Vitamin (MULTIVITAMIN) tablet Take 1 tablet by mouth daily.    . nitroGLYCERIN (NITROSTAT) 0.4 MG SL tablet Place 1 tablet (0.4 mg total) under the tongue every 5 (five) minutes as needed for chest pain. 25 tablet 3  . NON FORMULARY Marine -d3, 1 tablet twice daily    . omeprazole (PRILOSEC) 10 MG capsule Take 10 mg by mouth daily.    . pantoprazole (PROTONIX) 40 MG tablet Take 1 tablet (40 mg total) by mouth daily. everyother day (Patient not taking: Reported on 01/20/2019) 15 tablet 11  . rosuvastatin (CRESTOR) 40 MG tablet Take 1 tablet (40 mg total) by mouth daily. 90 tablet 3  . sacubitril-valsartan (ENTRESTO) 24-26 MG Take 1 tablet by mouth 2 (two) times daily. 60 tablet 0  . vitamin C (ASCORBIC ACID) 500 MG tablet Take 500 mg by mouth daily.      No current facility-administered medications on file prior to visit.     Cardiovascular studies:   Echocardiogram 01/13/2019: Left ventricle cavity is normal in size. Mild concentric hypertrophy of the left ventricle. Moderate decrease in global wall motion. Abnormal septal wall motion due to post-operative coronary artery bypass graft. Doppler evidence of grade I (impaired) diastolic dysfunction, normal LAP. Calculated EF 30%. Left atrial cavity is mildly dilated. IVC is dilated with respiratory variation. Estimated RA pressure 8 mmHg.  Labs reviewed.  Review of Systems  Constitution: Negative for decreased appetite, malaise/fatigue, weight gain and weight loss.  HENT: Negative for congestion.   Eyes: Negative for visual disturbance.  Cardiovascular: Negative for chest pain, dyspnea on exertion, leg swelling, palpitations and syncope.  Respiratory: Negative for shortness of breath.   Endocrine: Negative for cold intolerance.  Hematologic/Lymphatic: Does not bruise/bleed easily.  Skin: Negative for itching and rash.  Musculoskeletal: Negative for myalgias.  Gastrointestinal: Negative for abdominal pain, nausea and vomiting.   Genitourinary: Negative for dysuria.  Neurological: Negative for dizziness and weakness.  Psychiatric/Behavioral: The patient is not nervous/anxious.   All other systems reviewed and are negative.      Objective:    Vitals:   02/05/19 1206  BP: 94/62  Pulse: 90  SpO2: 94%     Physical Exam  Constitutional: He is oriented to person, place, and time. He appears well-developed and well-nourished. No distress.  HENT:  Head: Normocephalic and atraumatic.  Eyes: Pupils are equal, round, and reactive to light. Conjunctivae are normal.  Neck: No JVD present.  Cardiovascular: Normal rate and regular rhythm.  Pulmonary/Chest: Effort normal and breath sounds normal. He has no wheezes. He  has no rales.  Sternotomy scar  Abdominal: Soft. Bowel sounds are normal. There is no rebound.  Musculoskeletal:        General: No edema.  Lymphadenopathy:    He has no cervical adenopathy.  Neurological: He is alert and oriented to person, place, and time. No cranial nerve deficit.  Skin: Skin is warm and dry.  Psychiatric: He has a normal mood and affect.  Nursing note and vitals reviewed.       Assessment & Recommendations:    78 y/o Caucasian male with CAD, multiple prior PCI's, recent NSTEMI and CABG X4 in 10/2018 in [place], ischemic cardiomyopathy EF 30-35%, controlled hypertension, hyperlipidemia.  1. Chronic systolic heart failure (HCC) NYHA class I-II symptoms.  Given borderline low blood pressure, continue Entresto at 24-26 mg bid.  Gave more samples. Lisinopril was stopped 48 hrs prior to starting Entresto. Reduce metoprolol succinate to 12.5 mg daily. Encourage adequate hydration to avoid symptoms of lightheadedness due to hypotension. We will repeat echocardiogram in 05/14/2019.  If no improvement in EF on maximal medical therapy for heart failure, may need consideration for ICD. Referred to cardiac rehab.  2. Coronary artery disease involving coronary bypass graft of native  heart without angina pectoris Continue aspirin, plavix at least till 10/2019. Recommend resuming Crestor at higher dose 40 mg daily.  Patient is not interested in PCSK9 inhibitor therapy.  Return office/virtual visit in 8 weeks.   Elder Negus, MD [facility] Pager: [phone] Office: [phone] If no answer Cell [phone]

## 2019-02-14 ENCOUNTER — Other Ambulatory Visit: Payer: Self-pay | Admitting: Cardiology

## 2019-02-14 DIAGNOSIS — I2510 Atherosclerotic heart disease of native coronary artery without angina pectoris: Secondary | ICD-10-CM

## 2019-03-04 ENCOUNTER — Other Ambulatory Visit: Payer: Self-pay | Admitting: Cardiology

## 2019-03-04 DIAGNOSIS — I2510 Atherosclerotic heart disease of native coronary artery without angina pectoris: Secondary | ICD-10-CM

## 2019-03-10 ENCOUNTER — Other Ambulatory Visit: Payer: Self-pay | Admitting: Cardiology

## 2019-03-10 DIAGNOSIS — I2510 Atherosclerotic heart disease of native coronary artery without angina pectoris: Secondary | ICD-10-CM

## 2019-03-19 ENCOUNTER — Other Ambulatory Visit: Payer: Self-pay

## 2019-03-19 DIAGNOSIS — I5022 Chronic systolic (congestive) heart failure: Secondary | ICD-10-CM

## 2019-03-19 MED ORDER — SACUBITRIL-VALSARTAN 24-26 MG PO TABS: 1.0000 | ORAL_TABLET | Freq: Two times a day (BID) | ORAL | 0 refills | Status: DC

## 2019-04-02 ENCOUNTER — Encounter: Payer: Self-pay | Admitting: Cardiology

## 2019-04-02 ENCOUNTER — Ambulatory Visit (INDEPENDENT_AMBULATORY_CARE_PROVIDER_SITE_OTHER): Payer: Medicare Other | Admitting: Cardiology

## 2019-04-02 ENCOUNTER — Other Ambulatory Visit: Payer: Self-pay

## 2019-04-02 VITALS — BP 141/80 | HR 77 | Ht 70.0 in | Wt 178.0 lb

## 2019-04-02 DIAGNOSIS — I25810 Atherosclerosis of coronary artery bypass graft(s) without angina pectoris: Secondary | ICD-10-CM

## 2019-04-02 DIAGNOSIS — I255 Ischemic cardiomyopathy: Secondary | ICD-10-CM | POA: Diagnosis not present

## 2019-04-02 DIAGNOSIS — I5022 Chronic systolic (congestive) heart failure: Secondary | ICD-10-CM | POA: Diagnosis not present

## 2019-04-02 MED ORDER — METOPROLOL SUCCINATE ER 25 MG PO TB24: 25.0000 mg | ORAL_TABLET | Freq: Every day | ORAL | 3 refills | Status: DC

## 2019-04-02 NOTE — Progress Notes (Signed)
Patient is here for follow up visit.  Subjective:   Kenneth Booth, male    DOB: November 28, 1940, 78 y.o.   MRN: [id number]   I connected with the patient on 04/02/2019 by a video enabled telemedicine application and verified that I am speaking with the correct person using two identifiers.     I discussed the limitations of evaluation and management by telemedicine and the availability of in person appointments. The patient expressed understanding and agreed to proceed.   This visit type was conducted due to national recommendations for restrictions regarding the COVID-19 Pandemic (e.g. social distancing).  This format is felt to be most appropriate for this patient at this time.  All issues noted in this document were discussed and addressed.  No physical exam was performed (except for noted visual exam findings with Tele health visits).  The patient has consented to conduct a Tele health visit and understands insurance will be billed.   Chief Complaint  Patient presents with  . Congestive Heart Failure  . Coronary Artery Disease  . Follow-up    8wk     HPI  78 y/o Caucasian male with CAD, multiple prior PCI's, recent NSTEMI and CABG X4 in 10/2018 in [place], ischemic cardiomyopathy EF 30-35%, controlled hypertension, hyperlipidemia.  Since his last visit, he has not had any chest pain, shortness of breath symptoms. He walks about a mile regularly. He denies any lightheadedness symptoms. BP is elevated today.  Past Medical History:  Diagnosis Date  . Coronary artery disease    inf. STEMI 12/12:  LHC 10/29/11: Mid left main 20% with diffuse 20-30%, LAD calcified and diffusely diseased with proximal 90%, mid 80-90%; circumflex okay; mid RCA stent 50-60% ISR, occluded after a large PDA.  PCI: Promus DES x2 (one DES tx ISR of old stent) and distal vessel balloon angioplasty.  Given the high-grade LAD and diagonal disease, CABG rec for LAD - pt declined  . GERD (gastroesophageal  reflux disease)   . HLD (hyperlipidemia)      Past Surgical History:  Procedure Laterality Date  . ANGIOPLASTY    . CORONARY ANGIOPLASTY     1980 [facility]  . LEFT HEART CATHETERIZATION WITH CORONARY ANGIOGRAM N/A 10/29/2011   Procedure: LEFT HEART CATHETERIZATION WITH CORONARY ANGIOGRAM;  Surgeon: Herby Abraham, MD;  Location: [facility] CATH LAB;  Service: Cardiovascular;  Laterality: N/A;  . PERCUTANEOUS CORONARY STENT INTERVENTION (PCI-S) N/A 10/29/2011   Procedure: PERCUTANEOUS CORONARY STENT INTERVENTION (PCI-S);  Surgeon: Herby Abraham, MD;  Location: [facility] CATH LAB;  Service: Cardiovascular;  Laterality: N/A;     Social History   Socioeconomic History  . Marital status: Widowed    Spouse name: Not on file  . Number of children: 2  . Years of education: Not on file  . Highest education level: Not on file  Occupational History  . Occupation: Retired  Engineer, production  . Financial resource strain: Not on file  . Food insecurity:    Worry: Not on file    Inability: Not on file  . Transportation needs:    Medical: Not on file    Non-medical: Not on file  Tobacco Use  . Smoking status: Former Smoker    Packs/day: 2.00    Years: 25.00    Pack years: 50.00    Types: Cigarettes    Last attempt to quit: 1988    Years since quitting: 32.4  . Smokeless tobacco: Former Neurosurgeon    Quit date: 10/28/1981  Substance  and Sexual Activity  . Alcohol use: Yes    Alcohol/week: 7.0 standard drinks    Types: 7 Standard drinks or equivalent per week    Comment: nightly  . Drug use: Yes    Frequency: 7.0 times per week  . Sexual activity: Yes  Lifestyle  . Physical activity:    Days per week: Not on file    Minutes per session: Not on file  . Stress: Not on file  Relationships  . Social connections:    Talks on phone: Not on file    Gets together: Not on file    Attends religious service: Not on file    Active member of club or organization: Not on file    Attends meetings of clubs or  organizations: Not on file    Relationship status: Not on file  . Intimate partner violence:    Fear of current or ex partner: Not on file    Emotionally abused: Not on file    Physically abused: Not on file    Forced sexual activity: Not on file  Other Topics Concern  . Not on file  Social History Narrative   Married           Current Outpatient Medications on File Prior to Visit  Medication Sig Dispense Refill  . acetaminophen (TYLENOL) 325 MG tablet Take 650 mg by mouth as needed.     Marland Kitchen aspirin EC 81 MG tablet Take 81 mg by mouth daily.    . B Complex-C-Folic Acid (STRESS FORMULA PO) Take by mouth daily.     [name] 580 MG CAPS Take by mouth 2 (two) times daily.    . clopidogrel (PLAVIX) 75 MG tablet TAKE 1 TABLET BY MOUTH EVERY DAY 90 tablet 0  . Garlic 1250 MG TABS Take 1,250 mg by mouth daily.    . metoprolol succinate (TOPROL-XL) 25 MG 24 hr tablet Take 0.5 tablets (12.5 mg total) by mouth daily. 60 tablet 3  . Multiple Vitamin (MULTIVITAMIN) tablet Take 1 tablet by mouth daily.    . nitroGLYCERIN (NITROSTAT) 0.4 MG SL tablet TAKE 1 TABLET BY MOUTH EVERY DAY 75 tablet 1  . pantoprazole (PROTONIX) 40 MG tablet Take 1 tablet (40 mg total) by mouth daily. everyother day (Patient taking differently: Take 40 mg by mouth. ) 15 tablet 11  . rosuvastatin (CRESTOR) 20 MG tablet TAKE 1 TABLET BY MOUTH EVERY DAY 90 tablet 0  . rosuvastatin (CRESTOR) 40 MG tablet Take 1 tablet (40 mg total) by mouth daily. 90 tablet 3  . sacubitril-valsartan (ENTRESTO) 24-26 MG Take 1 tablet by mouth 2 (two) times daily. 180 tablet 0  . vitamin C (ASCORBIC ACID) 500 MG tablet Take 500 mg by mouth daily.      No current facility-administered medications on file prior to visit.     Cardiovascular studies:   Echocardiogram 01/13/2019: Left ventricle cavity is normal in size. Mild concentric hypertrophy of the left ventricle. Moderate decrease in global wall motion. Abnormal septal wall motion due  to post-operative coronary artery bypass graft. Doppler evidence of grade I (impaired) diastolic dysfunction, normal LAP. Calculated EF 30%. Left atrial cavity is mildly dilated. IVC is dilated with respiratory variation. Estimated RA pressure 8 mmHg.  Labs  Results for IREOLUWA, SOISSON (MRN [id number]) as of 04/02/2019 10:27  Ref. Range 01/29/2019 12:24  BASIC METABOLIC PANEL Unknown Rpt (A)  Sodium Latest Ref Range: 134 - 144 mmol/L 143  Potassium Latest Ref Range: 3.5 -  5.2 mmol/L 4.8  Chloride Latest Ref Range: 96 - 106 mmol/L 102  CO2 Latest Ref Range: 20 - 29 mmol/L 24  Glucose Latest Ref Range: 65 - 99 mg/dL 161 (H)  BUN Latest Ref Range: 8 - 27 mg/dL 7 (L)  Creatinine Latest Ref Range: 0.76 - 1.27 mg/dL 0.96  Calcium Latest Ref Range: 8.6 - 10.2 mg/dL 9.1  BUN/Creatinine Ratio Latest Ref Range: 10 - 24  8 (L)  GFR, Est Non African American Latest Ref Range: >59 mL/min/1.73 84  GFR, Est African American Latest Ref Range: >59 mL/min/1.73 97    Results for ALBERTA, CAIRNS (MRN [id number]) as of 04/02/2019 10:27  Ref. Range 01/09/2019 10:19  WBC Latest Ref Range: 3.4 - 10.8 x10E3/uL 5.5  RBC Latest Ref Range: 4.14 - 5.80 x10E6/uL 4.61  Hemoglobin Latest Ref Range: 13.0 - 17.7 g/dL 91.4  HCT Latest Ref Range: 37.5 - 51.0 % 40.1  MCV Latest Ref Range: 79 - 97 fL 87  MCH Latest Ref Range: 26.6 - 33.0 pg 29.1  MCHC Latest Ref Range: 31.5 - 35.7 g/dL 78.2  RDW Latest Ref Range: 11.6 - 15.4 % 12.8  Platelets Latest Ref Range: 150 - 450 x10E3/uL 196    Review of Systems  Constitution: Negative for decreased appetite, malaise/fatigue, weight gain and weight loss.  HENT: Negative for congestion.   Eyes: Negative for visual disturbance.  Cardiovascular: Negative for chest pain, dyspnea on exertion, leg swelling, palpitations and syncope.  Respiratory: Negative for shortness of breath.   Endocrine: Negative for cold intolerance.  Hematologic/Lymphatic: Does not bruise/bleed  easily.  Skin: Negative for itching and rash.  Musculoskeletal: Negative for myalgias.  Gastrointestinal: Negative for abdominal pain, nausea and vomiting.  Genitourinary: Negative for dysuria.  Neurological: Negative for dizziness and weakness.  Psychiatric/Behavioral: The patient is not nervous/anxious.   All other systems reviewed and are negative.      Objective:    Vitals:   04/02/19 1018  BP: (!) 141/80  Pulse: 77     Physical Exam  Constitutional: He is oriented to person, place, and time. He appears well-developed and well-nourished. No distress.  HENT:  Head: Normocephalic and atraumatic.  Eyes: Pupils are equal, round, and reactive to light. Conjunctivae are normal.  Neck: No JVD present.  Cardiovascular: Normal rate and regular rhythm.  Pulmonary/Chest: Effort normal and breath sounds normal. He has no wheezes. He has no rales.  Sternotomy scar  Abdominal: Soft. Bowel sounds are normal. There is no rebound.  Musculoskeletal:        General: No edema.  Lymphadenopathy:    He has no cervical adenopathy.  Neurological: He is alert and oriented to person, place, and time. No cranial nerve deficit.  Skin: Skin is warm and dry.  Psychiatric: He has a normal mood and affect.  Nursing note and vitals reviewed.       Assessment & Recommendations:    78 y/o Caucasian male with CAD, multiple prior PCI's, recent NSTEMI and CABG X4 in 10/2018 in [place], ischemic cardiomyopathy EF 30-35%, controlled hypertension, hyperlipidemia.  1. Chronic systolic heart failure (HCC) NYHA class I-II symptoms.  Continue Entresto at 24-26 mg bid.  Increase metoprolol succinate to 25 mg daily. Encourage adequate hydration to avoid symptoms of lightheadedness due to hypotension. We will repeat echocardiogram in 09//2020. Hopefully, we can uptitrate his medical therapy till then. If no improvement in EF on maximal medical therapy for heart failure, may need consideration for ICD.  2.  Coronary artery  disease involving coronary bypass graft of native heart without angina pectoris Continue aspirin, plavix at least till 10/2019. Recommend resuming Crestor at higher dose 40 mg daily.  Patient is not interested in PCSK9 inhibitor therapy.  F/u telephone visit in 4 wks. Will hope to uptitrate Entresto to 49-1 mg bid.  Elder NegusManish J Telford Archambeau, MD [facility] Pager: [phone] Office: [phone] If no answer Cell [phone]

## 2019-04-10 NOTE — Progress Notes (Signed)
Patient is here for follow up visit.  Subjective:   Kenneth Booth, male    DOB: 1940/11/24, 78 y.o.   MRN: [id number]   I connected with the patient on 04/11/2019 by telephone. I verified that I am speaking with the correct person using two identifiers.     I discussed the limitations of evaluation and management by telemedicine and the availability of in person appointments. The patient expressed understanding and agreed to proceed.   This visit type was conducted due to national recommendations for restrictions regarding the COVID-19 Pandemic (e.g. social distancing).  This format is felt to be most appropriate for this patient at this time.  All issues noted in this document were discussed and addressed.  No physical exam was performed (except for noted visual exam findings with Tele health visits).  The patient has consented to conduct a Tele health visit and understands insurance will be billed.   Chief complaint: Cardiomyopathy  HPI  78 y/o Caucasian male with CAD, multiple prior PCI's, recent NSTEMI and CABG X4 in 10/2018 in [place], ischemic cardiomyopathy EF 30-35%, controlled hypertension, hyperlipidemia.  At last visit, I continue Entresto at 24-26 mg bid, and increased metoprolol succinate to 25 mg daily. BP is 110/80 mmHg.   Echocardiogram 01/13/2019: Left ventricle cavity is normal in size. Mild concentric hypertrophy of the left ventricle. Moderate decrease in global wall motion. Abnormal septal wall motion due to post-operative coronary artery bypass graft. Doppler evidence of grade I (impaired) diastolic dysfunction, normal LAP. Calculated EF 30%. Left atrial cavity is mildly dilated. IVC is dilated with respiratory variation. Estimated RA pressure 8 mmHg.  Labs  Results for Kenneth Booth, Kenneth Booth (MRN [id number]) as of 04/02/2019 10:27  Ref. Range 01/29/2019 12:24  BASIC METABOLIC PANEL Unknown Rpt (A)  Sodium Latest Ref Range: 134 - 144 mmol/L 143  Potassium  Latest Ref Range: 3.5 - 5.2 mmol/L 4.8  Chloride Latest Ref Range: 96 - 106 mmol/L 102  CO2 Latest Ref Range: 20 - 29 mmol/L 24  Glucose Latest Ref Range: 65 - 99 mg/dL 088 (H)  BUN Latest Ref Range: 8 - 27 mg/dL 7 (L)  Creatinine Latest Ref Range: 0.76 - 1.27 mg/dL 1.10  Calcium Latest Ref Range: 8.6 - 10.2 mg/dL 9.1  BUN/Creatinine Ratio Latest Ref Range: 10 - 24  8 (L)  GFR, Est Non African American Latest Ref Range: >59 mL/min/1.73 84  GFR, Est African American Latest Ref Range: >59 mL/min/1.73 97    Results for Kenneth Booth, Kenneth Booth (MRN [id number]) as of 04/02/2019 10:27  Ref. Range 01/09/2019 10:19  WBC Latest Ref Range: 3.4 - 10.8 x10E3/uL 5.5  RBC Latest Ref Range: 4.14 - 5.80 x10E6/uL 4.61  Hemoglobin Latest Ref Range: 13.0 - 17.7 g/dL 29.2  HCT Latest Ref Range: 37.5 - 51.0 % 40.1  MCV Latest Ref Range: 79 - 97 fL 87  MCH Latest Ref Range: 26.6 - 33.0 pg 29.1  MCHC Latest Ref Range: 31.5 - 35.7 g/dL 44.6  RDW Latest Ref Range: 11.6 - 15.4 % 12.8  Platelets Latest Ref Range: 150 - 450 x10E3/uL 196       Objective:   Vitals:   04/11/19 0912  BP: 110/80  Pulse: 77         Assessment & Recommendations:    78 y/o Caucasian male with CAD, multiple prior PCI's, recent NSTEMI and CABG X4 in 10/2018 in [place], ischemic cardiomyopathy EF 30-35%, controlled hypertension, hyperlipidemia.   1. Chronic systolic heart failure (  HCC) NYHA class I-II symptoms.  Continue Entresto at 24-26 mg bid, metoprolol succinate to 25 mg daily. I do not think I can uptirate these medications further given low normal BP.  Started corlanor 5 mg bid.  We will repeat echocardiogram in 09//2020. Hopefully, we can uptitrate his medical therapy till then. If no improvement in EF on maximal medical therapy for heart failure, may need consideration for ICD.  2. Coronary artery disease involving coronary bypass graft of native heart without angina pectoris Continue aspirin, plavix at least till  10/2019. Recommend resuming Crestor at higher dose 40 mg daily.  Patient is not interested in PCSK9 inhibitor therapy.  F/u office visit in 6-8 wks.  Elder NegusManish J Savva Beamer, MD [facility] Pager: [phone] Office: [phone] If no answer Cell [phone]

## 2019-04-11 ENCOUNTER — Ambulatory Visit (INDEPENDENT_AMBULATORY_CARE_PROVIDER_SITE_OTHER): Payer: Medicare Other | Admitting: Cardiology

## 2019-04-11 ENCOUNTER — Other Ambulatory Visit: Payer: Self-pay

## 2019-04-11 ENCOUNTER — Encounter: Payer: Self-pay | Admitting: Cardiology

## 2019-04-11 VITALS — BP 110/80 | HR 77 | Wt 179.0 lb

## 2019-04-11 DIAGNOSIS — I255 Ischemic cardiomyopathy: Secondary | ICD-10-CM

## 2019-04-11 DIAGNOSIS — I5022 Chronic systolic (congestive) heart failure: Secondary | ICD-10-CM | POA: Diagnosis not present

## 2019-04-11 DIAGNOSIS — I25810 Atherosclerosis of coronary artery bypass graft(s) without angina pectoris: Secondary | ICD-10-CM

## 2019-04-11 MED ORDER — IVABRADINE HCL 5 MG PO TABS: 5.0000 mg | ORAL_TABLET | Freq: Two times a day (BID) | ORAL | 2 refills | Status: DC

## 2019-04-25 ENCOUNTER — Telehealth: Payer: Self-pay

## 2019-05-21 ENCOUNTER — Other Ambulatory Visit: Payer: Medicare Other

## 2019-05-22 ENCOUNTER — Encounter: Payer: Self-pay | Admitting: Cardiology

## 2019-05-22 ENCOUNTER — Ambulatory Visit (INDEPENDENT_AMBULATORY_CARE_PROVIDER_SITE_OTHER): Payer: Medicare Other | Admitting: Cardiology

## 2019-05-22 ENCOUNTER — Other Ambulatory Visit: Payer: Self-pay

## 2019-05-22 VITALS — BP 129/74 | HR 77 | Temp 97.5°F | Ht 70.0 in | Wt 193.1 lb

## 2019-05-22 DIAGNOSIS — I255 Ischemic cardiomyopathy: Secondary | ICD-10-CM | POA: Diagnosis not present

## 2019-05-22 DIAGNOSIS — I2510 Atherosclerotic heart disease of native coronary artery without angina pectoris: Secondary | ICD-10-CM

## 2019-05-22 DIAGNOSIS — I5022 Chronic systolic (congestive) heart failure: Secondary | ICD-10-CM

## 2019-05-22 NOTE — Progress Notes (Signed)
Patient is here for follow up visit.  Subjective:   Kenneth Booth, male    DOB: [birth year], 78 y.o.   MRN: [id number]    Chief Complaint  Patient presents with  . Congestive Heart Failure  . Coronary Artery Disease  . Follow-up    6wk     HPI  78 y/o Caucasian male with CAD, multiple prior PCI's, recent NSTEMI and CABG X4 in 10/2018 in [place], ischemic cardiomyopathy EF 30-35%, controlled hypertension, hyperlipidemia.  Patient has been doing well and denies chest pain, shortness of breath, palpitations, leg edema, orthopnea, PND, TIA/syncope.  Corlanor was not approved by insurance, but he is taking Entresto and metoprolol regularly.  His lightheadedness symptoms are improved.   Past Medical History:  Diagnosis Date  . Coronary artery disease    inf. STEMI 12/12:  LHC 10/29/11: Mid left main 20% with diffuse 20-30%, LAD calcified and diffusely diseased with proximal 90%, mid 80-90%; circumflex okay; mid RCA stent 50-60% ISR, occluded after a large PDA.  PCI: Promus DES x2 (one DES tx ISR of old stent) and distal vessel balloon angioplasty.  Given the high-grade LAD and diagonal disease, CABG rec for LAD - pt declined  . GERD (gastroesophageal reflux disease)   . HLD (hyperlipidemia)      Past Surgical History:  Procedure Laterality Date  . ANGIOPLASTY    . CORONARY ANGIOPLASTY     1980 [facility]  . LEFT HEART CATHETERIZATION WITH CORONARY ANGIOGRAM N/A 10/29/2011   Procedure: LEFT HEART CATHETERIZATION WITH CORONARY ANGIOGRAM;  Surgeon: Herby Abrahamhomas D Stuckey, MD;  [facility];  Service: Cardiovascular;  Laterality: N/A;  . PERCUTANEOUS CORONARY STENT INTERVENTION (PCI-S) N/A 10/29/2011   Procedure: PERCUTANEOUS CORONARY STENT INTERVENTION (PCI-S);  Surgeon: Herby Abrahamhomas D Stuckey, MD;  [facility];  Service: Cardiovascular;  Laterality: N/A;     Social History   Socioeconomic History  . Marital status: Widowed    Spouse name: Not on file  . Number of  children: 2  . Years of education: Not on file  . Highest education level: Not on file  Occupational History  . Occupation: Retired  Engineer, productionocial Needs  . Financial resource strain: Not on file  . Food insecurity    Worry: Not on file    Inability: Not on file  . Transportation needs    Medical: Not on file    Non-medical: Not on file  Tobacco Use  . Smoking status: Former Smoker    Packs/day: 2.00    Years: 25.00    Pack years: 50.00    Types: Cigarettes    Quit date: 1988    Years since quitting: 32.5  . Smokeless tobacco: Former NeurosurgeonUser    Quit date: 10/28/1981  Substance and Sexual Activity  . Alcohol use: Yes    Alcohol/week: 7.0 standard drinks    Types: 7 Standard drinks or equivalent per week    Comment: nightly  . Drug use: Yes    Frequency: 7.0 times per week  . Sexual activity: Yes  Lifestyle  . Physical activity    Days per week: Not on file    Minutes per session: Not on file  . Stress: Not on file  Relationships  . Social Musicianconnections    Talks on phone: Not on file    Gets together: Not on file    Attends religious service: Not on file    Active member of club or organization: Not on file    Attends  meetings of clubs or organizations: Not on file    Relationship status: Not on file  . Intimate partner violence    Fear of current or ex partner: Not on file    Emotionally abused: Not on file    Physically abused: Not on file    Forced sexual activity: Not on file  Other Topics Concern  . Not on file  Social History Narrative   Married           Current Outpatient Medications on File Prior to Visit  Medication Sig Dispense Refill  . acetaminophen (TYLENOL) 325 MG tablet Take 650 mg by mouth as needed.     Marland Kitchen aspirin EC 81 MG tablet Take 81 mg by mouth daily.    . B Complex-C-Folic Acid (STRESS FORMULA PO) Take by mouth daily.     . clopidogrel (PLAVIX) 75 MG tablet TAKE 1 TABLET BY MOUTH EVERY DAY 90 tablet 0  . doxazosin (CARDURA) 1 MG tablet Take 1 mg  by mouth daily.    . Garlic 4431 MG TABS Take 1,250 mg by mouth daily.    . ivabradine (CORLANOR) 5 MG TABS tablet Take 1 tablet (5 mg total) by mouth 2 (two) times daily with a meal. 60 tablet 2  . metoprolol succinate (TOPROL-XL) 25 MG 24 hr tablet Take 1 tablet (25 mg total) by mouth daily. 60 tablet 3  . Multiple Vitamin (MULTIVITAMIN) tablet Take 1 tablet by mouth daily.    . nitroGLYCERIN (NITROSTAT) 0.4 MG SL tablet TAKE 1 TABLET BY MOUTH EVERY DAY 75 tablet 1  . omeprazole (PRILOSEC) 20 MG capsule Take 20 mg by mouth daily.    . rosuvastatin (CRESTOR) 40 MG tablet Take 1 tablet (40 mg total) by mouth daily. 90 tablet 3  . sacubitril-valsartan (ENTRESTO) 24-26 MG Take 1 tablet by mouth 2 (two) times daily. 180 tablet 0  . vitamin C (ASCORBIC ACID) 500 MG tablet Take 500 mg by mouth daily.      No current facility-administered medications on file prior to visit.     Cardiovascular studies:  EKG 05/22/2019: Sinus rhythm 74 bpm.  Old inferior infarct . Poor R wave progression. Leftward axis.   Echocardiogram 01/13/2019: Left ventricle cavity is normal in size. Mild concentric hypertrophy of the left ventricle. Moderate decrease in global wall motion. Abnormal septal wall motion due to post-operative coronary artery bypass graft. Doppler evidence of grade I (impaired) diastolic dysfunction, normal LAP. Calculated EF 30%. Left atrial cavity is mildly dilated. IVC is dilated with respiratory variation. Estimated RA pressure 8 mmHg.  Labs  Results for MARCK, MCCLENNY (MRN [id number]) as of 04/02/2019 10:27  Ref. Range 01/29/2019 95:09  BASIC METABOLIC PANEL Unknown Rpt (A)  Sodium Latest Ref Range: 134 - 144 mmol/L 143  Potassium Latest Ref Range: 3.5 - 5.2 mmol/L 4.8  Chloride Latest Ref Range: 96 - 106 mmol/L 102  CO2 Latest Ref Range: 20 - 29 mmol/L 24  Glucose Latest Ref Range: 65 - 99 mg/dL 102 (H)  BUN Latest Ref Range: 8 - 27 mg/dL 7 (L)  Creatinine Latest Ref Range:  0.76 - 1.27 mg/dL 0.88  Calcium Latest Ref Range: 8.6 - 10.2 mg/dL 9.1  BUN/Creatinine Ratio Latest Ref Range: 10 - 24  8 (L)  GFR, Est Non African American Latest Ref Range: >59 mL/min/1.73 84  GFR, Est African American Latest Ref Range: >59 mL/min/1.73 97    Results for VADHIR, MCNAY (MRN [id number]) as of 04/02/2019 10:27  Ref. Range 01/09/2019 10:19  WBC Latest Ref Range: 3.4 - 10.8 x10E3/uL 5.5  RBC Latest Ref Range: 4.14 - 5.80 x10E6/uL 4.61  Hemoglobin Latest Ref Range: 13.0 - 17.7 g/dL 40.913.4  HCT Latest Ref Range: 37.5 - 51.0 % 40.1  MCV Latest Ref Range: 79 - 97 fL 87  MCH Latest Ref Range: 26.6 - 33.0 pg 29.1  MCHC Latest Ref Range: 31.5 - 35.7 g/dL 81.133.4  RDW Latest Ref Range: 11.6 - 15.4 % 12.8  Platelets Latest Ref Range: 150 - 450 x10E3/uL 196    Review of Systems  Constitution: Negative for decreased appetite, malaise/fatigue, weight gain and weight loss.  HENT: Negative for congestion.   Eyes: Negative for visual disturbance.  Cardiovascular: Negative for chest pain, dyspnea on exertion, leg swelling, palpitations and syncope.  Respiratory: Negative for shortness of breath.   Endocrine: Negative for cold intolerance.  Hematologic/Lymphatic: Does not bruise/bleed easily.  Skin: Negative for itching and rash.  Musculoskeletal: Negative for myalgias.  Gastrointestinal: Negative for abdominal pain, nausea and vomiting.  Genitourinary: Negative for dysuria.  Neurological: Negative for dizziness and weakness.  Psychiatric/Behavioral: The patient is not nervous/anxious.   All other systems reviewed and are negative.      Objective:    Vitals:   05/22/19 1149  BP: 129/74  Pulse: 77  Temp: (!) 97.5 F (36.4 C)  SpO2: 96%     Physical Exam  Constitutional: He is oriented to person, place, and time. He appears well-developed and well-nourished. No distress.  HENT:  Head: Normocephalic and atraumatic.  Eyes: Pupils are equal, round, and reactive to light.  Conjunctivae are normal.  Neck: No JVD present.  Cardiovascular: Normal rate and regular rhythm.  Pulmonary/Chest: Effort normal and breath sounds normal. He has no wheezes. He has no rales.  Sternotomy scar  Abdominal: Soft. Bowel sounds are normal. There is no rebound.  Musculoskeletal:        General: No edema.  Lymphadenopathy:    He has no cervical adenopathy.  Neurological: He is alert and oriented to person, place, and time. No cranial nerve deficit.  Skin: Skin is warm and dry.  Psychiatric: He has a normal mood and affect.  Nursing note and vitals reviewed.       Assessment & Recommendations:    78 y/o Caucasian male with CAD, multiple prior PCI's, recent NSTEMI and CABG X4 in 10/2018 in [place], ischemic cardiomyopathy EF 30-35%, controlled hypertension, hyperlipidemia.   1. Chronic systolic heart failure (HCC) NYHA class I-II symptoms.  Patient states that he feels fine and does not want to uptitrate Entresto today.  Continue Entresto at 24-26 mg bid, metoprolol succinate to 25 mg daily. We will repeat echocardiogram in 09//2020.  If no improvement in EF on maximal medical therapy for heart failure, may need consideration for ICD.  2. Coronary artery disease involving coronary bypass graft of native heart without angina pectoris Continue aspirin, plavix at least till 10/2019. Recommend resuming Crestor at higher dose 40 mg daily.  Patient is not interested in PCSK9 inhibitor therapy.  Follow-up in September after repeat echocardiogram.  Elder NegusManish J Liyana Suniga, MD [facility] Pager: [phone] Office: [phone] If no answer Cell [phone]

## 2019-05-28 ENCOUNTER — Other Ambulatory Visit: Payer: Self-pay | Admitting: Cardiology

## 2019-05-28 DIAGNOSIS — I2510 Atherosclerotic heart disease of native coronary artery without angina pectoris: Secondary | ICD-10-CM

## 2019-05-28 NOTE — Telephone Encounter (Signed)
Please fill if necessary

## 2019-05-29 ENCOUNTER — Other Ambulatory Visit: Payer: Self-pay

## 2019-05-29 DIAGNOSIS — K219 Gastro-esophageal reflux disease without esophagitis: Secondary | ICD-10-CM

## 2019-05-29 MED ORDER — PANTOPRAZOLE SODIUM 40 MG PO TBEC: 40.0000 mg | DELAYED_RELEASE_TABLET | Freq: Every day | ORAL | 3 refills | Status: DC

## 2019-05-29 NOTE — Telephone Encounter (Signed)
Spoke with pt went ahead and sent in med and he is aware

## 2019-06-10 ENCOUNTER — Other Ambulatory Visit: Payer: Self-pay | Admitting: Cardiology

## 2019-06-10 DIAGNOSIS — I5022 Chronic systolic (congestive) heart failure: Secondary | ICD-10-CM

## 2019-07-18 ENCOUNTER — Ambulatory Visit (INDEPENDENT_AMBULATORY_CARE_PROVIDER_SITE_OTHER): Payer: Medicare Other

## 2019-07-18 ENCOUNTER — Other Ambulatory Visit: Payer: Self-pay

## 2019-07-18 ENCOUNTER — Ambulatory Visit: Payer: Medicare Other | Admitting: Cardiology

## 2019-07-18 ENCOUNTER — Encounter: Payer: Self-pay | Admitting: Cardiology

## 2019-07-18 VITALS — BP 140/93 | HR 69 | Ht 70.0 in | Wt 190.5 lb

## 2019-07-18 DIAGNOSIS — I5022 Chronic systolic (congestive) heart failure: Secondary | ICD-10-CM

## 2019-07-18 DIAGNOSIS — I255 Ischemic cardiomyopathy: Secondary | ICD-10-CM

## 2019-07-18 DIAGNOSIS — I2510 Atherosclerotic heart disease of native coronary artery without angina pectoris: Secondary | ICD-10-CM | POA: Diagnosis not present

## 2019-07-18 NOTE — Progress Notes (Signed)
Patient is here for follow up visit.  Subjective:   Kenneth Booth, male    DOB: 10-01-1941, 78 y.o.   MRN: [id number]    Chief Complaint  Patient presents with  . Congestive Heart Failure  . Results    echo  . Follow-up     HPI  78 y/o Caucasian male with CAD, multiple prior PCI's, recent NSTEMI and CABG X4 in 10/2018 in [place], ischemic cardiomyopathy EF 30-35%, controlled hypertension, hyperlipidemia.  Patient has been doing well and denies chest pain, shortness of breath, palpitations, leg edema, orthopnea, PND, TIA/syncope.  He walks on his [place] property with only mild, stable shortness of breath.  Echocardiogram today shows improved LVEF to 50%.   Past Medical History:  Diagnosis Date  . Coronary artery disease    inf. STEMI 12/12:  LHC 10/29/11: Mid left main 20% with diffuse 20-30%, LAD calcified and diffusely diseased with proximal 90%, mid 80-90%; circumflex okay; mid RCA stent 50-60% ISR, occluded after a large PDA.  PCI: Promus DES x2 (one DES tx ISR of old stent) and distal vessel balloon angioplasty.  Given the high-grade LAD and diagonal disease, CABG rec for LAD - pt declined  . GERD (gastroesophageal reflux disease)   . HLD (hyperlipidemia)      Past Surgical History:  Procedure Laterality Date  . ANGIOPLASTY    . CORONARY ANGIOPLASTY     1980 [facility]  . LEFT HEART CATHETERIZATION WITH CORONARY ANGIOGRAM N/A 10/29/2011   Procedure: LEFT HEART CATHETERIZATION WITH CORONARY ANGIOGRAM;  Surgeon: Herby Abraham, MD;  Location: [facility] LAB;  Service: Cardiovascular;  Laterality: N/A;  . PERCUTANEOUS CORONARY STENT INTERVENTION (PCI-S) N/A 10/29/2011   Procedure: PERCUTANEOUS CORONARY STENT INTERVENTION (PCI-S);  Surgeon: Herby Abraham, MD;  Location: [facility] CATH LAB;  Service: Cardiovascular;  Laterality: N/A;     Social History   Socioeconomic History  . Marital status: Widowed    Spouse name: Not on file  . Number of children: 2  . Years  of education: Not on file  . Highest education level: Not on file  Occupational History  . Occupation: Retired  Engineer, production  . Financial resource strain: Not on file  . Food insecurity    Worry: Not on file    Inability: Not on file  . Transportation needs    Medical: Not on file    Non-medical: Not on file  Tobacco Use  . Smoking status: Former Smoker    Packs/day: 2.00    Years: 25.00    Pack years: 50.00    Types: Cigarettes    Quit date: 1988    Years since quitting: 32.6  . Smokeless tobacco: Former Neurosurgeon    Quit date: 10/28/1981  Substance and Sexual Activity  . Alcohol use: Yes    Alcohol/week: 7.0 standard drinks    Types: 7 Standard drinks or equivalent per week    Comment: nightly  . Drug use: Yes    Frequency: 7.0 times per week  . Sexual activity: Yes  Lifestyle  . Physical activity    Days per week: Not on file    Minutes per session: Not on file  . Stress: Not on file  Relationships  . Social Musician on phone: Not on file    Gets together: Not on file    Attends religious service: Not on file    Active member of club or organization: Not on file    Attends  meetings of clubs or organizations: Not on file    Relationship status: Not on file  . Intimate partner violence    Fear of current or ex partner: Not on file    Emotionally abused: Not on file    Physically abused: Not on file    Forced sexual activity: Not on file  Other Topics Concern  . Not on file  Social History Narrative   Married           Current Outpatient Medications on File Prior to Visit  Medication Sig Dispense Refill  . acetaminophen (TYLENOL) 325 MG tablet Take 650 mg by mouth as needed.     Marland Kitchen aspirin EC 81 MG tablet Take 81 mg by mouth daily.    . B Complex-C-Folic Acid (STRESS FORMULA PO) Take by mouth daily.     . clopidogrel (PLAVIX) 75 MG tablet TAKE 1 TABLET BY MOUTH EVERY DAY 90 tablet 0  . doxazosin (CARDURA) 1 MG tablet Take 1 mg by mouth daily.    Marland Kitchen  ENTRESTO 24-26 MG TAKE 1 TABLET BY MOUTH TWICE A DAY 180 tablet 0  . Garlic 5462 MG TABS Take 1,000 mg by mouth daily.     . metoprolol succinate (TOPROL-XL) 25 MG 24 hr tablet Take 1 tablet (25 mg total) by mouth daily. 60 tablet 3  . nitroGLYCERIN (NITROSTAT) 0.4 MG SL tablet TAKE 1 TABLET BY MOUTH EVERY DAY 75 tablet 1  . pantoprazole (PROTONIX) 40 MG tablet Take 1 tablet (40 mg total) by mouth daily. 90 tablet 3  . rosuvastatin (CRESTOR) 40 MG tablet Take 1 tablet (40 mg total) by mouth daily. 90 tablet 3  . vitamin B-12 (CYANOCOBALAMIN) 1000 MCG tablet Take 1,000 mcg by mouth daily.    . vitamin C (ASCORBIC ACID) 500 MG tablet Take 500 mg by mouth daily.      No current facility-administered medications on file prior to visit.     Cardiovascular studies:  Echocardiogram 07/18/2019: Left ventricle cavity is normal in size. Mild concentric hypertrophy of the left ventricle. Apical akinesis. Septal dyskinesis due to s/p CABG. Low normal LV systolic function with EF 50-55%. Doppler evidence of grade I (impaired) diastolic dysfunction, normal LAP.  Left atrial cavity is mildly dilated. Mild (Grade I) mitral regurgitation. Mild tricuspid regurgitation. Estimated pulmonary artery systolic pressure is 27 mmHg. Compared to previous study on 01/13/2019, LVEF is significantly improved.    EKG 05/22/2019: Sinus rhythm 74 bpm.  Old inferior infarct . Poor R wave progression. Leftward axis.   Echocardiogram 01/13/2019: Left ventricle cavity is normal in size. Mild concentric hypertrophy of the left ventricle. Moderate decrease in global wall motion. Abnormal septal wall motion due to post-operative coronary artery bypass graft. Doppler evidence of grade I (impaired) diastolic dysfunction, normal LAP. Calculated EF 30%. Left atrial cavity is mildly dilated. IVC is dilated with respiratory variation. Estimated RA pressure 8 mmHg.  Labs  Results for Kenneth Booth (MRN [id number]) as of  04/02/2019 10:27  Ref. Range 01/29/2019 18:29  BASIC METABOLIC PANEL Unknown Rpt (A)  Sodium Latest Ref Range: 134 - 144 mmol/L 143  Potassium Latest Ref Range: 3.5 - 5.2 mmol/L 4.8  Chloride Latest Ref Range: 96 - 106 mmol/L 102  CO2 Latest Ref Range: 20 - 29 mmol/L 24  Glucose Latest Ref Range: 65 - 99 mg/dL 102 (H)  BUN Latest Ref Range: 8 - 27 mg/dL 7 (L)  Creatinine Latest Ref Range: 0.76 - 1.27 mg/dL 0.88  Calcium Latest  Ref Range: 8.6 - 10.2 mg/dL 9.1  BUN/Creatinine Ratio Latest Ref Range: 10 - 24  8 (L)  GFR, Est Non African American Latest Ref Range: >59 mL/min/1.73 84  GFR, Est African American Latest Ref Range: >59 mL/min/1.73 97    Results for Malissa HippoFAHNESTOCK, Jari S (MRN [id number]) as of 04/02/2019 10:27  Ref. Range 01/09/2019 10:19  WBC Latest Ref Range: 3.4 - 10.8 x10E3/uL 5.5  RBC Latest Ref Range: 4.14 - 5.80 x10E6/uL 4.61  Hemoglobin Latest Ref Range: 13.0 - 17.7 g/dL 40.913.4  HCT Latest Ref Range: 37.5 - 51.0 % 40.1  MCV Latest Ref Range: 79 - 97 fL 87  MCH Latest Ref Range: 26.6 - 33.0 pg 29.1  MCHC Latest Ref Range: 31.5 - 35.7 g/dL 81.133.4  RDW Latest Ref Range: 11.6 - 15.4 % 12.8  Platelets Latest Ref Range: 150 - 450 x10E3/uL 196    Review of Systems  Constitution: Negative for decreased appetite, malaise/fatigue, weight gain and weight loss.  HENT: Negative for congestion.   Eyes: Negative for visual disturbance.  Cardiovascular: Negative for chest pain, dyspnea on exertion, leg swelling, palpitations and syncope.  Respiratory: Negative for shortness of breath.   Endocrine: Negative for cold intolerance.  Hematologic/Lymphatic: Does not bruise/bleed easily.  Skin: Negative for itching and rash.  Musculoskeletal: Negative for myalgias.  Gastrointestinal: Negative for abdominal pain, nausea and vomiting.  Genitourinary: Negative for dysuria.  Neurological: Negative for dizziness and weakness.  Psychiatric/Behavioral: The patient is not nervous/anxious.   All  other systems reviewed and are negative.      Objective:    Vitals:   07/18/19 1029  BP: (!) 140/93  Pulse: 69  SpO2: 96%     Physical Exam  Constitutional: He is oriented to person, place, and time. He appears well-developed and well-nourished. No distress.  HENT:  Head: Normocephalic and atraumatic.  Eyes: Pupils are equal, round, and reactive to light. Conjunctivae are normal.  Neck: No JVD present.  Cardiovascular: Normal rate and regular rhythm.  Pulmonary/Chest: Effort normal and breath sounds normal. He has no wheezes. He has no rales.  Sternotomy scar  Abdominal: Soft. Bowel sounds are normal. There is no rebound.  Musculoskeletal:        General: No edema.  Lymphadenopathy:    He has no cervical adenopathy.  Neurological: He is alert and oriented to person, place, and time. No cranial nerve deficit.  Skin: Skin is warm and dry.  Psychiatric: He has a normal mood and affect.  Nursing note and vitals reviewed.       Assessment & Recommendations:    78 y/o Caucasian male with CAD, multiple prior PCI's, recent NSTEMI and CABG X4 in 10/2018 in [place], ischemic cardiomyopathy EF 30-35%, controlled hypertension, hyperlipidemia.   1. Chronic systolic heart failure (HCC) NYHA class I-II symptoms.  EF improved to 50% (Echocardiogram 07/18/2019) Continue Entresto at 24-26 mg bid, metoprolol succinate to 25 mg daily.   2. Coronary artery disease involving coronary bypass graft of native heart without angina pectoris Continue aspirin, plavix at least till 10/2019. Recommend resuming Crestor at higher dose 40 mg daily.  Patient is not interested in PCSK9 inhibitor therapy.  F/u in 6 months  Donique Hammonds Emiliano DyerJ Lonzie Simmer, MD [facility] Pager: [phone] Office: [phone] If no answer Cell [phone]

## 2019-08-19 ENCOUNTER — Other Ambulatory Visit: Payer: Self-pay | Admitting: Cardiology

## 2019-08-19 DIAGNOSIS — I2510 Atherosclerotic heart disease of native coronary artery without angina pectoris: Secondary | ICD-10-CM

## 2019-08-19 NOTE — Telephone Encounter (Signed)
Error

## 2019-09-11 ENCOUNTER — Other Ambulatory Visit: Payer: Self-pay | Admitting: Cardiology

## 2019-09-11 DIAGNOSIS — I5022 Chronic systolic (congestive) heart failure: Secondary | ICD-10-CM

## 2019-09-23 ENCOUNTER — Other Ambulatory Visit: Payer: Self-pay | Admitting: Cardiology

## 2019-09-23 DIAGNOSIS — I5022 Chronic systolic (congestive) heart failure: Secondary | ICD-10-CM

## 2019-11-15 ENCOUNTER — Other Ambulatory Visit: Payer: Self-pay | Admitting: Cardiology

## 2019-11-15 DIAGNOSIS — I2510 Atherosclerotic heart disease of native coronary artery without angina pectoris: Secondary | ICD-10-CM

## 2019-12-07 ENCOUNTER — Other Ambulatory Visit: Payer: Self-pay | Admitting: Cardiology

## 2019-12-07 DIAGNOSIS — I5022 Chronic systolic (congestive) heart failure: Secondary | ICD-10-CM

## 2020-01-12 ENCOUNTER — Encounter: Payer: Self-pay | Admitting: Cardiology

## 2020-01-12 ENCOUNTER — Ambulatory Visit: Payer: Medicare Other | Admitting: Cardiology

## 2020-01-12 ENCOUNTER — Other Ambulatory Visit: Payer: Self-pay

## 2020-01-12 VITALS — BP 127/77 | HR 76 | Temp 97.6°F | Ht 70.0 in | Wt 200.0 lb

## 2020-01-12 DIAGNOSIS — I5022 Chronic systolic (congestive) heart failure: Secondary | ICD-10-CM

## 2020-01-12 DIAGNOSIS — I2510 Atherosclerotic heart disease of native coronary artery without angina pectoris: Secondary | ICD-10-CM

## 2020-01-12 DIAGNOSIS — I255 Ischemic cardiomyopathy: Secondary | ICD-10-CM

## 2020-01-12 NOTE — Progress Notes (Signed)
Patient is here for follow up visit.  Subjective:   Kenneth Booth, male    DOB: July 22, 1941, 79 y.o.   MRN: [id number]   Chief Complaint  Patient presents with  . Coronary Artery Disease  . Follow-up    6 month     HPI  79 y/o Caucasian male with CAD, multiple prior PCI's, recent NSTEMI and CABG X4 in 10/2018 in [place], ischemic cardiomyopathy, hypertension, hyperlipidemia.  Patient has been doing well and denies chest pain, shortness of breath, palpitations, leg edema, orthopnea, PND, TIA/syncope.  He endorses not being active through the summer.  He recently went to [place], where he walked for long distances on his [place] trip.  He got tired, but did not have any significant shortness of breath.  He hopes to start riding his bike again soon.  Few months ago, he noticed hematuria.  Hematuria completely cleared after Plavix.  Since then, has not been on Plavix.   Current Outpatient Medications on File Prior to Visit  Medication Sig Dispense Refill  . acetaminophen (TYLENOL) 325 MG tablet Take 650 mg by mouth as needed.     Marland Kitchen aspirin EC 81 MG tablet Take 81 mg by mouth daily.    . B Complex-C-Folic Acid (STRESS FORMULA PO) Take by mouth daily.     . clopidogrel (PLAVIX) 75 MG tablet TAKE 1 TABLET BY MOUTH EVERY DAY 90 tablet 0  . doxazosin (CARDURA) 1 MG tablet Take 1 mg by mouth daily.    Marland Kitchen ENTRESTO 24-26 MG TAKE 1 TABLET BY MOUTH TWICE A DAY 180 tablet 0  . Garlic 4098 MG TABS Take 1,000 mg by mouth daily.     . metoprolol succinate (TOPROL-XL) 25 MG 24 hr tablet TAKE 1 TABLET BY MOUTH EVERY DAY 90 tablet 2  . nitroGLYCERIN (NITROSTAT) 0.4 MG SL tablet TAKE 1 TABLET BY MOUTH EVERY DAY 75 tablet 1  . pantoprazole (PROTONIX) 40 MG tablet Take 1 tablet (40 mg total) by mouth daily. 90 tablet 3  . rosuvastatin (CRESTOR) 40 MG tablet Take 1 tablet (40 mg total) by mouth daily. 90 tablet 3  . vitamin B-12 (CYANOCOBALAMIN) 1000 MCG tablet Take 1,000 mcg by mouth daily.      . vitamin C (ASCORBIC ACID) 500 MG tablet Take 500 mg by mouth daily.      No current facility-administered medications on file prior to visit.    Cardiovascular studies:  EKG 01/12/20: Sinus rhythm 72 bpm. Left anterior fascicular block.  Old inferior infarct.  Echocardiogram 07/18/2019: Left ventricle cavity is normal in size. Mild concentric hypertrophy of the left ventricle. Apical akinesis. Septal dyskinesis due to s/p CABG. Low normal LV systolic function with EF 50-55%. Doppler evidence of grade I (impaired) diastolic dysfunction, normal LAP.  Left atrial cavity is mildly dilated. Mild (Grade I) mitral regurgitation. Mild tricuspid regurgitation. Estimated pulmonary artery systolic pressure is 27 mmHg. Compared to previous study on 01/13/2019, LVEF is significantly improved.    EKG 05/22/2019: Sinus rhythm 74 bpm.  Old inferior infarct . Poor R wave progression. Leftward axis.   Echocardiogram 01/13/2019: Left ventricle cavity is normal in size. Mild concentric hypertrophy of the left ventricle. Moderate decrease in global wall motion. Abnormal septal wall motion due to post-operative coronary artery bypass graft. Doppler evidence of grade I (impaired) diastolic dysfunction, normal LAP. Calculated EF 30%. Left atrial cavity is mildly dilated. IVC is dilated with respiratory variation. Estimated RA pressure 8 mmHg.  Labs  01/29/2019: Glucose 102,  BUN/Cr 7/0.88. EGFR 84. Na/K 143/4.8. Rest of the CMP normal H/H 13/40. MCV 87. Platelets 196 Chol 155, TG 95, HDL 50, LDL 86  Review of Systems  Cardiovascular: Negative for chest pain, dyspnea on exertion, leg swelling, palpitations and syncope.         Objective:    Vitals:   01/12/20 1149  BP: 127/77  Pulse: 76  Temp: 97.6 F (36.4 C)  SpO2: 97%    Physical Exam  Constitutional: He appears well-developed and well-nourished.  Neck: No JVD present.  Cardiovascular: Normal rate, regular rhythm, normal heart  sounds and intact distal pulses.  No murmur heard. Pulmonary/Chest: Effort normal and breath sounds normal. He has no wheezes. He has no rales.  Well healed sternotomy scar  Musculoskeletal:        General: No edema.  Nursing note and vitals reviewed.        Assessment & Recommendations:    79 y/o Caucasian male with CAD, multiple prior PCI's, recent NSTEMI and CABG X4 in 10/2018 in [place], ischemic cardiomyopathy, hypertension, hyperlipidemia.  Chronic systolic heart failure (HCC) NYHA class I-II symptoms.  EF improved to 50% (Echocardiogram 07/18/2019) Continue Entresto at 24-26 mg bid, metoprolol succinate to 25 mg daily.  We will check CBC, BMP, lipid panel.  2. Coronary artery disease involving coronary bypass graft of native heart without angina pectoris Continue aspirin, statin.  Will check lipid panel.  Previously, has not been interested in starting PCSK9 inhibitor therapy.   F/u in 6 months  Takako Minckler Esther Hardy, MD [facility] [place] Pager: [phone] Office: [phone] If no answer Cell [phone]

## 2020-01-16 ENCOUNTER — Ambulatory Visit: Payer: Medicare Other | Admitting: Cardiology

## 2020-02-10 ENCOUNTER — Other Ambulatory Visit: Payer: Self-pay | Admitting: Cardiology

## 2020-02-10 DIAGNOSIS — I25810 Atherosclerosis of coronary artery bypass graft(s) without angina pectoris: Secondary | ICD-10-CM

## 2020-03-16 ENCOUNTER — Other Ambulatory Visit: Payer: Self-pay | Admitting: Cardiology

## 2020-03-16 DIAGNOSIS — I5022 Chronic systolic (congestive) heart failure: Secondary | ICD-10-CM

## 2020-05-15 ENCOUNTER — Other Ambulatory Visit: Payer: Self-pay | Admitting: Cardiology

## 2020-05-15 DIAGNOSIS — K219 Gastro-esophageal reflux disease without esophagitis: Secondary | ICD-10-CM

## 2020-06-09 ENCOUNTER — Other Ambulatory Visit: Payer: Self-pay | Admitting: Cardiology

## 2020-06-09 DIAGNOSIS — I5022 Chronic systolic (congestive) heart failure: Secondary | ICD-10-CM

## 2020-06-14 ENCOUNTER — Other Ambulatory Visit: Payer: Self-pay | Admitting: Cardiology

## 2020-06-14 DIAGNOSIS — I5022 Chronic systolic (congestive) heart failure: Secondary | ICD-10-CM

## 2020-07-15 ENCOUNTER — Ambulatory Visit: Payer: Medicare Other | Admitting: Cardiology

## 2020-07-15 ENCOUNTER — Encounter: Payer: Self-pay | Admitting: Cardiology

## 2020-07-15 ENCOUNTER — Other Ambulatory Visit: Payer: Self-pay

## 2020-07-15 VITALS — BP 143/79 | HR 84 | Resp 17 | Ht 70.0 in | Wt 210.0 lb

## 2020-07-15 DIAGNOSIS — I255 Ischemic cardiomyopathy: Secondary | ICD-10-CM

## 2020-07-15 DIAGNOSIS — I25810 Atherosclerosis of coronary artery bypass graft(s) without angina pectoris: Secondary | ICD-10-CM

## 2020-07-15 MED ORDER — ATORVASTATIN CALCIUM 20 MG PO TABS: 20.0000 mg | ORAL_TABLET | Freq: Every day | ORAL | 3 refills | Status: DC

## 2020-07-15 NOTE — Progress Notes (Signed)
Patient is here for follow up visit.  Subjective:   Kenneth Booth, male    DOB: February 04, 1941, 79 y.o.   MRN: [id number]   No chief complaint on file.    HPI  79 y/o Caucasian male with CAD, multiple prior PCI's, recent NSTEMI and CABG X4 in 10/2018 in [place], ischemic cardiomyopathy with recovered EF, hypertension, hyperlipidemia.  He is doing well without any cardiac complaints.  Colonoscopies, he has had constant neck pain blood pressure elevated today.  He has not been using Entresto due to cost issues.  He is also stopped using Crestor due to myalgias which improved after discontinuation of Crestor.   Current Outpatient Medications on File Prior to Visit  Medication Sig Dispense Refill  . acetaminophen (TYLENOL) 325 MG tablet Take 650 mg by mouth as needed.     Marland Kitchen aspirin EC 81 MG tablet Take 81 mg by mouth daily.    . B Complex-C-Folic Acid (STRESS FORMULA PO) Take by mouth daily.     Marland Kitchen doxazosin (CARDURA) 1 MG tablet Take 1 mg by mouth daily.    Marland Kitchen ENTRESTO 24-26 MG TAKE 1 TABLET BY MOUTH TWICE A DAY 180 tablet 0  . Garlic 5320 MG TABS Take 1,000 mg by mouth daily.     . metoprolol succinate (TOPROL-XL) 25 MG 24 hr tablet TAKE 1 TABLET BY MOUTH EVERY DAY 90 tablet 2  . nitroGLYCERIN (NITROSTAT) 0.4 MG SL tablet TAKE 1 TABLET BY MOUTH EVERY DAY 75 tablet 1  . pantoprazole (PROTONIX) 40 MG tablet TAKE 1 TABLET BY MOUTH EVERY DAY 90 tablet 3  . rosuvastatin (CRESTOR) 40 MG tablet TAKE 1 TABLET BY MOUTH EVERY DAY 90 tablet 3  . vitamin B-12 (CYANOCOBALAMIN) 1000 MCG tablet Take 1,000 mcg by mouth daily.    . vitamin C (ASCORBIC ACID) 500 MG tablet Take 500 mg by mouth daily.      No current facility-administered medications on file prior to visit.    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 Left anterior fascicular block.  Old  inferior infarct.  Echocardiogram 07/18/2019: Left ventricle cavity is normal in size. Mild concentric hypertrophy of the left ventricle. Apical akinesis. Septal dyskinesis due to s/p CABG. Low normal LV systolic function with EF 50-55%. Doppler evidence of grade I (impaired) diastolic dysfunction, normal LAP.  Left atrial cavity is mildly dilated. Mild (Grade I) mitral regurgitation. Mild tricuspid regurgitation. Estimated pulmonary artery systolic pressure is 27 mmHg. Compared to previous study on 01/13/2019, LVEF is significantly improved.    EKG 05/22/2019: Sinus rhythm 74 bpm.  Old inferior infarct . Poor R wave progression. Leftward axis.   Echocardiogram 01/13/2019: Left ventricle cavity is normal in size. Mild concentric hypertrophy of the left ventricle. Moderate decrease in global wall motion. Abnormal septal wall motion due to post-operative coronary artery bypass graft. Doppler evidence of grade I (impaired) diastolic dysfunction, normal LAP. Calculated EF 30%. Left atrial cavity is mildly dilated. IVC is dilated with respiratory variation. Estimated RA pressure 8 mmHg.  Labs: 04/19/2020: Glucose 93. BUN/Cr 9/0.92. eGFR 80. Na/K 141/4.1.  Chol 127, TG 92, HDL 53, LDL 57  01/29/2019: Glucose 102, BUN/Cr 7/0.88. EGFR 84. Na/K 143/4.8. Rest of the CMP normal H/H 13/40. MCV 87. Platelets 196 Chol 155, TG 95, HDL 50, LDL 86  Review of Systems  Cardiovascular: Negative for chest pain, dyspnea on  exertion, leg swelling, palpitations and syncope.         Objective:    Vitals:   07/15/20 1145  BP: (!) 143/79  Pulse: 84  Resp: 17  SpO2: 96%    Physical Exam Vitals and nursing note reviewed.  Constitutional:      Appearance: He is well-developed.  Neck:     Vascular: No JVD.  Cardiovascular:     Rate and Rhythm: Normal rate and regular rhythm.     Pulses: Intact distal pulses.     Heart sounds: Normal heart sounds. No murmur heard.   Pulmonary:     Effort:  Pulmonary effort is normal.     Breath sounds: Normal breath sounds. No wheezing or rales.          Assessment & Recommendations:   79 y/o Caucasian male with CAD, multiple prior PCI's, recent NSTEMI and CABG X4 in 10/2018 in [place], ischemic cardiomyopathy with recovered EF, hypertension, hyperlipidemia.  Chronic systolic heart failure (HCC) NYHA class I symptoms.  EF improved to 50% (Echocardiogram 07/18/2019) Is currently not taking Entresto.  Given his concerns for cost and relative lack of symptoms, I am not restarting Entresto at this time. Continue metoprolol succinate to 25 mg daily.  We will repeat echocardiogram in 3 months.  CAD: S/p CABG. Continue aspirin While LDL was very well controlled when he was on Crestor, he is currently not taking Crestor due to myalgias.  Recommend starting Lipitor 20 mg daily. We will check lipid panel in 3 months.   F/u in 3 months  [place], MD [facility] [place] Pager: [phone] Office: [phone] If no answer Cell [phone]

## 2020-09-29 LAB — LIPID PANEL
Chol/HDL Ratio: 3.3 ratio (ref 0.0–5.0)
Cholesterol, Total: 174 mg/dL (ref 100–199)
HDL: 53 mg/dL (ref >39)
LDL Chol Calc (NIH): 100 mg/dL — ABNORMAL HIGH (ref 0–99)
Triglycerides: 117 mg/dL (ref 0–149)
VLDL Cholesterol Cal: 21 mg/dL (ref 5–40)

## 2020-10-06 ENCOUNTER — Other Ambulatory Visit: Payer: Self-pay | Admitting: Cardiology

## 2020-10-06 DIAGNOSIS — I25810 Atherosclerosis of coronary artery bypass graft(s) without angina pectoris: Secondary | ICD-10-CM

## 2020-10-12 ENCOUNTER — Ambulatory Visit: Payer: Medicare Other

## 2020-10-12 ENCOUNTER — Other Ambulatory Visit: Payer: Self-pay

## 2020-10-12 DIAGNOSIS — I255 Ischemic cardiomyopathy: Secondary | ICD-10-CM

## 2020-10-18 ENCOUNTER — Ambulatory Visit: Payer: Medicare Other | Admitting: Cardiology

## 2020-10-19 NOTE — Progress Notes (Signed)
Patient is here for follow up visit.  Subjective:   Kenneth Booth, male    DOB: 11-02-1941, 79 y.o.   MRN: [id number]   Chief Complaint  Patient presents with  . Coronary artery disease involving coronary bypass graft of n  . Follow-up    HPI  79 y/o Caucasian male with CAD, multiple prior PCI's, recent NSTEMI and CABG X4 in 10/2018 in [place], ischemic cardiomyopathy with recovered EF, hypertension, hyperlipidemia.  Patient is doing well, denies chest pain, shortness of breath, palpitations, leg edema, orthopnea, PND, TIA/syncope.  Reviewed recent echocardiogram and labs with the patient, details below.    Current Outpatient Medications on File Prior to Visit  Medication Sig Dispense Refill  . acetaminophen (TYLENOL) 325 MG tablet Take 650 mg by mouth as needed.     Marland Kitchen aspirin EC 81 MG tablet Take 81 mg by mouth daily.    Marland Kitchen atorvastatin (LIPITOR) 20 MG tablet TAKE 1 TABLET BY MOUTH EVERY DAY 90 tablet 1  . B Complex-C-Folic Acid (STRESS FORMULA PO) Take by mouth daily.     Marland Kitchen doxazosin (CARDURA) 1 MG tablet Take 1 mg by mouth daily.    . Garlic 6468 MG TABS Take 1,000 mg by mouth daily.     . metoprolol succinate (TOPROL-XL) 25 MG 24 hr tablet TAKE 1 TABLET BY MOUTH EVERY DAY 90 tablet 2  . nitroGLYCERIN (NITROSTAT) 0.4 MG SL tablet TAKE 1 TABLET BY MOUTH EVERY DAY 75 tablet 1  . pantoprazole (PROTONIX) 40 MG tablet TAKE 1 TABLET BY MOUTH EVERY DAY 90 tablet 3  . vitamin B-12 (CYANOCOBALAMIN) 1000 MCG tablet Take 1,000 mcg by mouth daily.    . vitamin C (ASCORBIC ACID) 500 MG tablet Take 500 mg by mouth daily.      No current facility-administered medications on file prior to visit.    Cardiovascular studies:  Echocardiogram 10/12/2020:  Left ventricle cavity is normal in size and wall thickness. Abnormal  septal wall motion due to post-operative coronary artery bypass graft.  Normal LV systolic function with visual EF 50-55%. Doppler evidence of  grade II  (pseudonormal) diastolic dysfunction, elevated LAP.  Left atrial cavity is mildly dilated.  Trace MR, trace TR.  No evidence of pulmonary hypertension.  No significant change compared to previous study on 07/18/2019.  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 BUN/Cr 9/0.92. eGFR 80. Na/K 141/4.1.  Chol 127, TG 92, HDL 53, LDL 57  01/29/2019: Glucose 102, BUN/Cr 7/0.88. EGFR 84. Na/K 143/4.8. Rest of the CMP normal H/H 13/40. MCV 87. Platelets 196 Chol 155, TG 95, HDL 50, LDL 86  Review of Systems  Cardiovascular: Negative for chest pain, dyspnea on exertion, leg swelling, palpitations and syncope.         Objective:    Vitals:   10/20/20 1058  BP: (!) 145/91  Pulse: 70  Resp: 16  SpO2: 98%    Physical Exam Vitals and nursing note reviewed.  Constitutional:      Appearance: He is well-developed.  Neck:     Vascular: No JVD.  Cardiovascular:     Rate and Rhythm: Normal rate and regular rhythm.     Pulses: Intact distal pulses.     Heart sounds: Normal heart sounds. No murmur heard.   Pulmonary:     Effort: Pulmonary effort is normal.  Breath sounds: Normal breath sounds. No wheezing or rales.          Assessment & Recommendations:   79 y/o Caucasian male with CAD, multiple prior PCI's, recent NSTEMI and CABG X4 in 10/2018 in [place], ischemic cardiomyopathy with recovered EF, hypertension, hyperlipidemia.  Chronic systolic heart failure (HCC) NYHA class I symptoms.  EF improved to 50-55% Continue metoprolol succinate, increase to 50 mg daily to control blood pressure better.  Continue   CAD without angina: S/p CABG. Continue aspirin LDL has increased from 53 to 100 on low dose lipitor 20 mg. He does not want to make any changes right now, and is intolerant to higher doses of statin due to  myalgias. Encouraged him to make diet and lifestyle changes with heart healthy dit and regular walking.   F/u in 6 months  Langdon Crosson Esther Hardy, MD [facility] [place] Pager: [phone] Office: [phone] If no answer Cell [phone]

## 2020-10-20 ENCOUNTER — Other Ambulatory Visit: Payer: Self-pay

## 2020-10-20 ENCOUNTER — Ambulatory Visit: Payer: Medicare Other | Admitting: Cardiology

## 2020-10-20 ENCOUNTER — Encounter: Payer: Self-pay | Admitting: Cardiology

## 2020-10-20 VITALS — BP 145/91 | HR 70 | Resp 16 | Ht 70.0 in | Wt 204.0 lb

## 2020-10-20 DIAGNOSIS — I255 Ischemic cardiomyopathy: Secondary | ICD-10-CM

## 2020-10-20 DIAGNOSIS — I5022 Chronic systolic (congestive) heart failure: Secondary | ICD-10-CM

## 2020-10-20 DIAGNOSIS — I25810 Atherosclerosis of coronary artery bypass graft(s) without angina pectoris: Secondary | ICD-10-CM

## 2020-10-20 MED ORDER — ATORVASTATIN CALCIUM 20 MG PO TABS: 20.0000 mg | ORAL_TABLET | Freq: Every day | ORAL | 3 refills | Status: DC

## 2020-10-20 MED ORDER — METOPROLOL SUCCINATE ER 50 MG PO TB24: 50.0000 mg | ORAL_TABLET | Freq: Every day | ORAL | 3 refills | Status: DC

## 2020-10-20 NOTE — Patient Instructions (Signed)
Diet & Lifestyle recommendations:  Physical activity recommendation (The Physical Activity Guidelines for Americans. JAMA 2018;Nov 12) At least 150-300 minutes a week of moderate-intensity, or 75-150 minutes a week of vigorous-intensity aerobic physical activity, or an equivalent combination of moderate- and vigorous-intensity aerobic activity. Adults should perform muscle-strengthening activities on 2 or more days a week. Older adults should do multicomponent physical activity that includes balance training as well as aerobic and muscle-strengthening activities. Benefits of increased physical activity include lower risk of mortality including cardiovascular mortality, lower risk of cardiovascular events and associated risk factors (hypertension and diabetes), and lower risk of many cancers (including bladder, breast, colon, endometrium, esophagus, kidney, lung, and stomach). Additional improvments have been seen in cognition, risk of dementia, anxiety and depression, improved bone health, lower risk of falls, and associated injuries.  Dietary recommendation The 2019 ACC/AHA guidelines promote nutrition as a main fixture of cardiovascular wellness, with a recommendation for a varied diet of fruit, vegetables, fish, legumes, and whole grains (Class I), as well as recommendations to reduce sodium, cholesterol, processed meats, and refined sugars (Class IIa recommendation).10 Sodium intake, a topic of some controversy as of late, is recommended to be kept at 1,500 mg/day or less, far below the average daily intake in the [place] of 3,409 mg/day, and notably below that of previous US recommendations for <2,300mg/day.10,11 For those unable to reach 1,500 mg/day, they recommend at least a reduction of 1000 mg/day.  A Pesco-Mediterranean Diet With Intermittent Fasting: JACC Review Topic of the Week. J Am Coll Cardiol 2020;76:1484-1493 Pesco-Mediterranean diet, it is supplemented with extra-virgin olive oil (EVOO),  which is the principle fat source, along with moderate amounts of dairy (particularly yogurt and cheese) and eggs, as well as modest amounts of alcohol consumption (ideally red wine with the evening meal), but few red and processed meats.  

## 2021-01-07 DIAGNOSIS — H8110 Benign paroxysmal vertigo, unspecified ear: Secondary | ICD-10-CM | POA: Diagnosis not present

## 2021-04-20 ENCOUNTER — Ambulatory Visit: Payer: Medicare Other | Admitting: Cardiology

## 2021-04-20 ENCOUNTER — Other Ambulatory Visit: Payer: Self-pay

## 2021-04-20 ENCOUNTER — Encounter: Payer: Self-pay | Admitting: Cardiology

## 2021-04-20 VITALS — BP 171/70 | Temp 97.9°F | Resp 16 | Ht 70.0 in | Wt 207.0 lb

## 2021-04-20 DIAGNOSIS — I255 Ischemic cardiomyopathy: Secondary | ICD-10-CM | POA: Diagnosis not present

## 2021-04-20 DIAGNOSIS — I25810 Atherosclerosis of coronary artery bypass graft(s) without angina pectoris: Secondary | ICD-10-CM | POA: Diagnosis not present

## 2021-04-20 DIAGNOSIS — I10 Essential (primary) hypertension: Secondary | ICD-10-CM | POA: Insufficient documentation

## 2021-04-20 DIAGNOSIS — E782 Mixed hyperlipidemia: Secondary | ICD-10-CM | POA: Insufficient documentation

## 2021-04-20 NOTE — Progress Notes (Signed)
Patient is here for follow up visit.  Subjective:   Kenneth Booth, male    DOB: [birth year], 80 y.o.   MRN: [id number]   Chief Complaint  Patient presents with  . Coronary Artery Disease  . Follow-up    6 months  . Dizziness    HPI  80 y/o Caucasian male with CAD, multiple prior PCI's, recent NSTEMI and CABG X4 in 10/2018 in [place], ischemic cardiomyopathy with recovered EF, hypertension, hyperlipidemia.  Patient denies chest pain, shortness of breath, palpitations, leg edema, orthopnea, PND, TIA/syncope.  For last 2 to 3 months, he has noticed symptoms of dizziness especially when he lays down at night.  He has no symptoms while he is active, walking, driving.  He was given some medication by his PCP without any relief.  Blood pressure is elevated today, but patient tells me that he checks it at home and is always lower.  He has an upcoming visit with his PCP within the next few weeks.    Current Outpatient Medications on File Prior to Visit  Medication Sig Dispense Refill  . acetaminophen (TYLENOL) 325 MG tablet Take 650 mg by mouth as needed.    Marland Kitchen aspirin EC 81 MG tablet Take 81 mg by mouth daily.    Marland Kitchen atorvastatin (LIPITOR) 20 MG tablet Take 1 tablet (20 mg total) by mouth daily. 90 tablet 3  . B Complex-C-Folic Acid (STRESS FORMULA PO) Take by mouth daily.    Marland Kitchen doxazosin (CARDURA) 1 MG tablet Take 1 mg by mouth daily.    . Garlic 6384 MG TABS Take 1,000 mg by mouth daily.     . metoprolol succinate (TOPROL-XL) 50 MG 24 hr tablet Take 1 tablet (50 mg total) by mouth daily. 90 tablet 3  . nitroGLYCERIN (NITROSTAT) 0.4 MG SL tablet TAKE 1 TABLET BY MOUTH EVERY DAY 75 tablet 1  . pantoprazole (PROTONIX) 40 MG tablet TAKE 1 TABLET BY MOUTH EVERY DAY 90 tablet 3  . vitamin B-12 (CYANOCOBALAMIN) 1000 MCG tablet Take 1,000 mcg by mouth daily.    . vitamin C (ASCORBIC ACID) 500 MG tablet Take 500 mg by mouth daily.     No current facility-administered medications on file  prior to visit.    Cardiovascular studies:  EKG 04/20/2021: Sinus rhythm 69 bpm Old inferior infarct Poor R wave progression Occasional PVC Nonspecific T wave abnormality  Echocardiogram 10/12/2020:  Left ventricle cavity is normal in size and wall thickness. Abnormal  septal wall motion due to post-operative coronary artery bypass graft.  Normal LV systolic function with visual EF 50-55%. Doppler evidence of  grade II (pseudonormal) diastolic dysfunction, elevated LAP.  Left atrial cavity is mildly dilated.  Trace MR, trace TR.  No evidence of pulmonary hypertension.  No significant change compared to previous study on 07/18/2019.  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 BUN/Cr 9/0.92. eGFR 80. Na/K 141/4.1.  Chol 127, TG 92, HDL 53, LDL 57  01/29/2019: Glucose 102, BUN/Cr 7/0.88. EGFR 84. Na/K 143/4.8. Rest of the CMP normal H/H 13/40. MCV 87. Platelets 196 Chol 155, TG 95, HDL 50, LDL 86  Review of Systems  Cardiovascular: Negative for chest pain, dyspnea on exertion, leg swelling, palpitations and syncope.         Objective:    Vitals:   04/20/21 1139 04/20/21 1141  BP:    Resp:    Temp:    SpO2: 97% 95%   Orthostatic VS for the past 72 hrs (Last 3 readings):  Orthostatic BP Patient Position BP Location Cuff Size Orthostatic Pulse  04/20/21 1141 (!) 156/91 Standing Left Arm Large 71  04/20/21 1139 (!) 165/96 Sitting Left Arm Large 69  04/20/21 1137 (!) 165/100 Supine Left Arm Large 68     Physical Exam Vitals and nursing note reviewed.  Constitutional:      Appearance: He is well-developed.  Neck:     Vascular: No JVD.  Cardiovascular:     Rate and Rhythm: Normal rate and regular rhythm.     Pulses: Intact distal pulses.     Heart sounds: Normal heart sounds. No murmur heard.   Pulmonary:      Effort: Pulmonary effort is normal.     Breath sounds: Normal breath sounds. No wheezing or rales.          Assessment & Recommendations:   80 y/o Caucasian male with CAD, multiple prior PCI's, recent NSTEMI and CABG X4 in 10/2018 in [place], ischemic cardiomyopathy with recovered EF, hypertension, hyperlipidemia.  Chronic systolic heart failure (HCC) NYHA class I symptoms.  EF improved to 50-55% Continue metoprolol succinate 50 mg daily  CAD without angina: S/p CABG. Continue aspirin Currently on lipitor 20 mg. LDL 100. Check lipid panel.    Hypertension: Uncontrolled.  Suspect whitecoat hypertension given reportedly normal blood pressures at home. I encouraged him to keep a log of his blood pressures readings, and take the monitor to his next PCP visit to compare office readings and home readings. No change made today.  Dizziness: Typical of positional vertigo.  Defer management to PCP.  F/u in 6 months  Eissa Buchberger Esther Hardy, MD [facility] [place] Pager: [phone] Office: [phone] If no answer Cell [phone]

## 2021-04-28 DIAGNOSIS — I509 Heart failure, unspecified: Secondary | ICD-10-CM | POA: Diagnosis not present

## 2021-04-28 DIAGNOSIS — Z0000 Encounter for general adult medical examination without abnormal findings: Secondary | ICD-10-CM | POA: Diagnosis not present

## 2021-04-28 DIAGNOSIS — I5022 Chronic systolic (congestive) heart failure: Secondary | ICD-10-CM | POA: Diagnosis not present

## 2021-04-28 DIAGNOSIS — I10 Essential (primary) hypertension: Secondary | ICD-10-CM | POA: Diagnosis not present

## 2021-04-28 DIAGNOSIS — E785 Hyperlipidemia, unspecified: Secondary | ICD-10-CM | POA: Diagnosis not present

## 2021-04-28 DIAGNOSIS — H8110 Benign paroxysmal vertigo, unspecified ear: Secondary | ICD-10-CM | POA: Diagnosis not present

## 2021-05-23 ENCOUNTER — Other Ambulatory Visit: Payer: Self-pay | Admitting: Cardiology

## 2021-05-23 DIAGNOSIS — K219 Gastro-esophageal reflux disease without esophagitis: Secondary | ICD-10-CM

## 2021-10-20 ENCOUNTER — Other Ambulatory Visit: Payer: Self-pay | Admitting: Cardiology

## 2021-10-20 ENCOUNTER — Ambulatory Visit: Payer: Medicare Other | Admitting: Cardiology

## 2021-10-20 DIAGNOSIS — I25810 Atherosclerosis of coronary artery bypass graft(s) without angina pectoris: Secondary | ICD-10-CM

## 2021-10-20 DIAGNOSIS — I5022 Chronic systolic (congestive) heart failure: Secondary | ICD-10-CM

## 2021-12-29 ENCOUNTER — Other Ambulatory Visit: Payer: Self-pay | Admitting: Cardiology

## 2021-12-29 DIAGNOSIS — I25810 Atherosclerosis of coronary artery bypass graft(s) without angina pectoris: Secondary | ICD-10-CM

## 2022-05-08 DIAGNOSIS — I5022 Chronic systolic (congestive) heart failure: Secondary | ICD-10-CM | POA: Diagnosis not present

## 2022-05-08 DIAGNOSIS — I255 Ischemic cardiomyopathy: Secondary | ICD-10-CM | POA: Diagnosis not present

## 2022-05-08 DIAGNOSIS — Z23 Encounter for immunization: Secondary | ICD-10-CM | POA: Diagnosis not present

## 2022-05-08 DIAGNOSIS — J449 Chronic obstructive pulmonary disease, unspecified: Secondary | ICD-10-CM | POA: Diagnosis not present

## 2022-05-08 DIAGNOSIS — E785 Hyperlipidemia, unspecified: Secondary | ICD-10-CM | POA: Diagnosis not present

## 2022-05-08 DIAGNOSIS — I2510 Atherosclerotic heart disease of native coronary artery without angina pectoris: Secondary | ICD-10-CM | POA: Diagnosis not present

## 2022-05-08 DIAGNOSIS — Z0000 Encounter for general adult medical examination without abnormal findings: Secondary | ICD-10-CM | POA: Diagnosis not present

## 2022-05-08 DIAGNOSIS — I10 Essential (primary) hypertension: Secondary | ICD-10-CM | POA: Diagnosis not present

## 2022-05-15 ENCOUNTER — Other Ambulatory Visit: Payer: Self-pay | Admitting: Cardiology

## 2022-05-15 DIAGNOSIS — K219 Gastro-esophageal reflux disease without esophagitis: Secondary | ICD-10-CM

## 2022-11-09 DIAGNOSIS — R059 Cough, unspecified: Secondary | ICD-10-CM | POA: Diagnosis not present

## 2022-11-09 DIAGNOSIS — J329 Chronic sinusitis, unspecified: Secondary | ICD-10-CM | POA: Diagnosis not present

## 2022-11-09 DIAGNOSIS — J40 Bronchitis, not specified as acute or chronic: Secondary | ICD-10-CM | POA: Diagnosis not present

## 2022-11-09 DIAGNOSIS — R0981 Nasal congestion: Secondary | ICD-10-CM | POA: Diagnosis not present

## 2022-12-13 DIAGNOSIS — H2513 Age-related nuclear cataract, bilateral: Secondary | ICD-10-CM | POA: Diagnosis not present

## 2022-12-26 ENCOUNTER — Other Ambulatory Visit: Payer: Self-pay | Admitting: Cardiology

## 2022-12-26 DIAGNOSIS — I25810 Atherosclerosis of coronary artery bypass graft(s) without angina pectoris: Secondary | ICD-10-CM

## 2023-03-24 ENCOUNTER — Other Ambulatory Visit: Payer: Self-pay | Admitting: Cardiology

## 2023-03-24 DIAGNOSIS — I25810 Atherosclerosis of coronary artery bypass graft(s) without angina pectoris: Secondary | ICD-10-CM

## 2023-04-28 ENCOUNTER — Other Ambulatory Visit: Payer: Self-pay | Admitting: Cardiology

## 2023-04-28 DIAGNOSIS — I25810 Atherosclerosis of coronary artery bypass graft(s) without angina pectoris: Secondary | ICD-10-CM

## 2023-05-18 ENCOUNTER — Other Ambulatory Visit: Payer: Self-pay | Admitting: Cardiology

## 2023-05-18 DIAGNOSIS — K219 Gastro-esophageal reflux disease without esophagitis: Secondary | ICD-10-CM

## 2023-05-19 ENCOUNTER — Other Ambulatory Visit: Payer: Self-pay | Admitting: Cardiology

## 2023-05-19 DIAGNOSIS — I25810 Atherosclerosis of coronary artery bypass graft(s) without angina pectoris: Secondary | ICD-10-CM

## 2023-06-01 DIAGNOSIS — R300 Dysuria: Secondary | ICD-10-CM | POA: Diagnosis not present

## 2023-06-01 DIAGNOSIS — I5022 Chronic systolic (congestive) heart failure: Secondary | ICD-10-CM | POA: Diagnosis not present

## 2023-06-01 DIAGNOSIS — R6889 Other general symptoms and signs: Secondary | ICD-10-CM | POA: Diagnosis not present

## 2023-06-28 DIAGNOSIS — I255 Ischemic cardiomyopathy: Secondary | ICD-10-CM | POA: Diagnosis not present

## 2023-06-28 DIAGNOSIS — E785 Hyperlipidemia, unspecified: Secondary | ICD-10-CM | POA: Diagnosis not present

## 2023-06-28 DIAGNOSIS — I5022 Chronic systolic (congestive) heart failure: Secondary | ICD-10-CM | POA: Diagnosis not present

## 2023-06-28 DIAGNOSIS — R7303 Prediabetes: Secondary | ICD-10-CM | POA: Diagnosis not present

## 2023-06-28 DIAGNOSIS — Z0000 Encounter for general adult medical examination without abnormal findings: Secondary | ICD-10-CM | POA: Diagnosis not present

## 2023-06-28 DIAGNOSIS — J449 Chronic obstructive pulmonary disease, unspecified: Secondary | ICD-10-CM | POA: Diagnosis not present

## 2023-06-28 DIAGNOSIS — R300 Dysuria: Secondary | ICD-10-CM | POA: Diagnosis not present

## 2023-10-07 DIAGNOSIS — S8262XA Displaced fracture of lateral malleolus of left fibula, initial encounter for closed fracture: Secondary | ICD-10-CM | POA: Diagnosis not present

## 2023-10-15 DIAGNOSIS — M25572 Pain in left ankle and joints of left foot: Secondary | ICD-10-CM | POA: Diagnosis not present

## 2023-10-15 DIAGNOSIS — M79672 Pain in left foot: Secondary | ICD-10-CM | POA: Diagnosis not present

## 2023-10-17 DIAGNOSIS — M25571 Pain in right ankle and joints of right foot: Secondary | ICD-10-CM | POA: Diagnosis not present

## 2023-10-17 DIAGNOSIS — S8262XA Displaced fracture of lateral malleolus of left fibula, initial encounter for closed fracture: Secondary | ICD-10-CM | POA: Diagnosis not present

## 2023-10-20 DIAGNOSIS — M25572 Pain in left ankle and joints of left foot: Secondary | ICD-10-CM | POA: Diagnosis not present

## 2023-10-24 ENCOUNTER — Telehealth: Payer: Self-pay | Admitting: *Deleted

## 2023-10-24 DIAGNOSIS — S8262XA Displaced fracture of lateral malleolus of left fibula, initial encounter for closed fracture: Secondary | ICD-10-CM | POA: Diagnosis not present

## 2023-10-24 DIAGNOSIS — S86911A Strain of unspecified muscle(s) and tendon(s) at lower leg level, right leg, initial encounter: Secondary | ICD-10-CM | POA: Diagnosis not present

## 2023-10-24 DIAGNOSIS — M25572 Pain in left ankle and joints of left foot: Secondary | ICD-10-CM | POA: Diagnosis not present

## 2023-10-24 DIAGNOSIS — S86012A Strain of left Achilles tendon, initial encounter: Secondary | ICD-10-CM | POA: Diagnosis not present

## 2023-10-24 NOTE — Telephone Encounter (Signed)
   Pre-operative Risk Assessment    Patient Name: Kenneth Booth  DOB: 06/04/1941 MRN: [id number]  DATE OF LAST VISIT: 04/20/21 DR. PATWARDHAN DATE OF NEXT VISIT: NONE    Request for Surgical Clearance    Procedure:   LEFT ACHILLES TENDON REPAIR , LEFT PERONEUS BREVIS REPAIR vs TENDON TRANSFER; REPAIR OF DISLOCATED PERONEUS LONGUS TENDON  Date of Surgery:  Clearance TBD                                 Surgeon:  DR. Jonny Ruiz HEWITT Surgeon's Group or Practice Name:  [facility] Phone number:  [phone] Fax number:  [phone] MEGAN DAVIS   Type of Clearance Requested:   - Medical  - Pharmacy:  Hold Aspirin     Type of Anesthesia:  Not Indicated ON CLEARANCE FORM   Additional requests/questions:    Elpidio Anis   10/24/2023, 5:30 PM

## 2023-10-25 ENCOUNTER — Telehealth: Payer: Self-pay | Admitting: *Deleted

## 2023-10-25 NOTE — Telephone Encounter (Signed)
Pt has been scheduled tele pre op appt 11/09/23. Med rec and consent are done.

## 2023-10-25 NOTE — Telephone Encounter (Signed)
   Name: Kenneth Booth  DOB: Oct 29, 1941  MRN: [id number]  Primary Cardiologist: None   Preoperative team, please contact this patient and set up a phone call appointment for further preoperative risk assessment. Please obtain consent and complete medication review. Thank you for your help. Last seen by Dr. Rosemary Holms on 04/21/2023  I confirm that guidance regarding antiplatelet and oral anticoagulation therapy has been completed and, if necessary, noted below.  Per office protocol, if patient is without any new symptoms or concerns at the time of their virtual visit, he/she may hold ASA for 7 days prior to procedure. Please resume ASA as soon as possible postprocedure, at the discretion of the surgeon.    I also confirmed the patient resides in the state of [place]. As per [facility] Medical Board telemedicine laws, the patient must reside in the state in which the provider is licensed.   Joni Reining, NP 10/25/2023, 7:46 AM [facility]

## 2023-10-25 NOTE — Telephone Encounter (Signed)
Pt has been scheduled tele pre op appt 11/09/23. Med rec and consent are done.     Patient Consent for Virtual Visit        Kenneth Booth has provided verbal consent on 10/25/2023 for a virtual visit (video or telephone).   CONSENT FOR VIRTUAL VISIT FOR:  Kenneth Booth  By participating in this virtual visit I agree to the following:  I hereby voluntarily request, consent and authorize [facility] and its employed or contracted physicians, physician assistants, nurse practitioners or other licensed health care professionals (the Practitioner), to provide me with telemedicine health care services (the "Services") as deemed necessary by the treating Practitioner. I acknowledge and consent to receive the Services by the Practitioner via telemedicine. I understand that the telemedicine visit will involve communicating with the Practitioner through live audiovisual communication technology and the disclosure of certain medical information by electronic transmission. I acknowledge that I have been given the opportunity to request an in-person assessment or other available alternative prior to the telemedicine visit and am voluntarily participating in the telemedicine visit.  I understand that I have the right to withhold or withdraw my consent to the use of telemedicine in the course of my care at any time, without affecting my right to future care or treatment, and that the Practitioner or I may terminate the telemedicine visit at any time. I understand that I have the right to inspect all information obtained and/or recorded in the course of the telemedicine visit and may receive copies of available information for a reasonable fee.  I understand that some of the potential risks of receiving the Services via telemedicine include:  Delay or interruption in medical evaluation due to technological equipment failure or disruption; Information transmitted may not be sufficient (e.g.  poor resolution of images) to allow for appropriate medical decision making by the Practitioner; and/or  In rare instances, security protocols could fail, causing a breach of personal health information.  Furthermore, I acknowledge that it is my responsibility to provide information about my medical history, conditions and care that is complete and accurate to the best of my ability. I acknowledge that Practitioner's advice, recommendations, and/or decision may be based on factors not within their control, such as incomplete or inaccurate data provided by me or distortions of diagnostic images or specimens that may result from electronic transmissions. I understand that the practice of medicine is not an exact science and that Practitioner makes no warranties or guarantees regarding treatment outcomes. I acknowledge that a copy of this consent can be made available to me via my patient portal [facility] MyChart), or I can request a printed copy by calling the office of [facility].    I understand that my insurance will be billed for this visit.   I have read or had this consent read to me. I understand the contents of this consent, which adequately explains the benefits and risks of the Services being provided via telemedicine.  I have been provided ample opportunity to ask questions regarding this consent and the Services and have had my questions answered to my satisfaction. I give my informed consent for the services to be provided through the use of telemedicine in my medical care

## 2023-11-05 DIAGNOSIS — S86911D Strain of unspecified muscle(s) and tendon(s) at lower leg level, right leg, subsequent encounter: Secondary | ICD-10-CM | POA: Diagnosis not present

## 2023-11-05 DIAGNOSIS — S86012D Strain of left Achilles tendon, subsequent encounter: Secondary | ICD-10-CM | POA: Diagnosis not present

## 2023-11-08 ENCOUNTER — Ambulatory Visit: Payer: Medicare Other

## 2023-11-08 ENCOUNTER — Other Ambulatory Visit: Payer: Self-pay | Admitting: Cardiology

## 2023-11-08 DIAGNOSIS — K219 Gastro-esophageal reflux disease without esophagitis: Secondary | ICD-10-CM

## 2023-11-08 NOTE — Telephone Encounter (Signed)
S/w the pt and he is agreeable to in office appt per the pre op APP today. Pt last seen 2022 with Dr. Rosemary Holms. Pt has been scheduled to see Joni Reining, DNP 11/16/23 @ 8:50 @ [facility] office. Pt aware of address for [facility] office. I will update all parties involved.

## 2023-11-08 NOTE — Telephone Encounter (Signed)
   Name: Kenneth Booth  DOB: 02/28/1941  MRN: [id number]  Primary Cardiologist: None  Chart reviewed as part of pre-operative protocol coverage. Because of Alexys S Lie's past medical history and time since last visit, he will require a follow-up in-office visit in order to better assess preoperative cardiovascular risk. Patient last seen by Dr. Rosemary Holms on 04/20/21.   Pre-op covering staff: - Please schedule appointment and call patient to inform them. If patient already had an upcoming appointment within acceptable timeframe, please add "pre-op clearance" to the appointment notes so provider is aware. - Please contact requesting surgeon's office via preferred method (i.e, phone, fax) to inform them of need for appointment prior to surgery.  Rip Harbour, NP  11/08/2023, 7:09 AM

## 2023-11-09 DIAGNOSIS — I10 Essential (primary) hypertension: Secondary | ICD-10-CM | POA: Diagnosis not present

## 2023-11-09 DIAGNOSIS — R7301 Impaired fasting glucose: Secondary | ICD-10-CM | POA: Diagnosis not present

## 2023-11-13 NOTE — Progress Notes (Signed)
 Cardiology Office Note:  .   Date:  11/16/2023  ID:  Kenneth Booth, DOB 04-09-1941, MRN [id number] PCP: Nanci Senior, MD  [facility] Providers Cardiologist:  Dr. Elmira  History of Present Illness: .   Kenneth Booth is a 82 y.o. male CAD, multiple prior PCI's, recent NSTEMI and CABG X4 in 10/2018 in [place], ischemic cardiomyopathy with recovered EF, hypertension, hyperlipidemia and positional vertigo.  Last seen by Dr. Elmira on 04/20/2021.    He is here for preoperative cardiac evaluation to have a left Achilles tendon repair, left peroneus brevis repair versus tendon transfer, and repair of dislocated peroneus longus tendon by Dr. Norleen Armor on date to be determined through [facility].  Since being seen by cardiology has been doing well.  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  He remains very active doing so.  He travels, carries heavy equipment and car parts.  He denies any chest pain, palpitations, dizziness, or excessive fatigue associated with these activities.  He was actually in his attic repairing a part of his house when he fell from the ladder.  There was no syncopal episode associated.  ROS: As above otherwise negative.  Studies Reviewed: SABRA   EKG Interpretation Date/Time:  Friday November 16 2023 08:58:10 EST Ventricular Rate:  81 PR Interval:  184 QRS Duration:  86 QT Interval:  390 QTC Calculation: 453 R Axis:   -34  Text Interpretation: Normal sinus rhythm Left axis deviation Inferior infarct (cited on or before 30-Oct-2011) Anterior infarct , age undetermined When compared with ECG of 30-Oct-2011 06:36, Nonspecific T wave abnormality has replaced inverted T waves in Inferior leads Confirmed by Jerilynn Collar [phone]) on 11/16/2023 9:25:26 AM    Echocardiogram 10/12/2020:  Left ventricle cavity is normal in size and wall thickness. Abnormal  septal wall motion due to post-operative coronary artery bypass graft.  Normal LV  systolic function with visual EF 50-55%. Doppler evidence of  grade II (pseudonormal) diastolic dysfunction, elevated LAP.  Left atrial cavity is mildly dilated.  Trace MR, trace TR.  No evidence of pulmonary hypertension.  No significant change compared to previous study on 07/18/2019.  Physical Exam:   VS:  BP 132/70 (BP Location: Left Arm, Patient Position: Sitting)   Pulse 81   Ht 5' 10 (1.778 m)   Wt 207 lb (93.9 kg) Comment: Patient has a cast boot on  SpO2 92%   BMI 29.70 kg/m    Wt Readings from Last 3 Encounters:  11/16/23 207 lb (93.9 kg)  04/20/21 207 lb (93.9 kg)  10/20/20 204 lb (92.5 kg)    GEN: Well nourished, well developed in no acute distress NECK: No JVD; No carotid bruits CARDIAC: RRR, no murmurs, rubs, gallops RESPIRATORY:  Clear to auscultation without rales, wheezing or rhonchi  ABDOMEN: Soft, non-tender, non-distended EXTREMITIES:  No edema;  left ankle and brace/walking cast.  ASSESSMENT AND PLAN: .   Pre-operative Cardiac Evaluation According to the Revised Cardiac Risk Index (RCRI), his Perioperative Risk of Major Cardiac Event is (%): 0.4  His Functional Capacity in METs is: 8.97 according to the Duke Activity Status Index (DASI).   Per office protocol, if patient is without any new symptoms or concerns at the time of their virtual visit, he/she may hold ASA for 5-7  days prior to procedure. Please resume ASA as soon as possible postprocedure, at the discretion of the surgeon.       Therefore, based on ACC/AHA guidelines, patient would be  at acceptable risk for the planned procedure without further cardiovascular testing. I will route this recommendation to the requesting party via Epic fax function.   2.  CAD: History of CABG x 4 in the setting of NSTEMI in 10/2018.  This occurred in [place] .  The patient is doing well from a cardiac standpoint and offers no complaints of angina, fatigue, and remains very active.  He is medically compliant.  I  will have him follow-up with Dr. Elmira in the next 6 to 8 months for continued cardiac management.  3.  Hypertension: Blood pressure is well-controlled today on current medication regimen.  Continue metoprolol  50 mg daily.  He is not on ACE or ARB currently.  His PCP is following his blood pressure closely and she is due to see him next week.  He is recording this for her and bring it back.  Labs are also followed by PCP.  4.  Hypercholesterolemia: He remains on atorvastatin  20 mg daily.  Goal of LDL less than 70.  Primary care is providing surveillance and labs concerning this.  He sees her next week for ongoing management. Signed, Lamarr HERO. Jerilynn CHOL, ANP, AACC

## 2023-11-16 ENCOUNTER — Ambulatory Visit: Payer: Medicare Other | Attending: Adult Health | Admitting: Adult Health

## 2023-11-16 ENCOUNTER — Encounter: Payer: Self-pay | Admitting: Adult Health

## 2023-11-16 VITALS — BP 132/70 | HR 81 | Ht 70.0 in | Wt 207.0 lb

## 2023-11-16 DIAGNOSIS — Z01818 Encounter for other preprocedural examination: Secondary | ICD-10-CM | POA: Diagnosis not present

## 2023-11-16 NOTE — Patient Instructions (Signed)
 Medication Instructions:  NO CHANGES    Lab Work: NONE    Testing/Procedures: NONE   Follow-Up: At [facility], you and your health needs are our priority.  As part of our continuing mission to provide you with exceptional heart care, we have created designated Provider Care Teams.  These Care Teams include your primary Cardiologist (physician) and Advanced Practice Providers (APPs -  Physician Assistants and Nurse Practitioners) who all work together to provide you with the care you need, when you need it.  We recommend signing up for the patient portal called MyChart.  Sign up information is provided on this After Visit Summary.  MyChart is used to connect with patients for Virtual Visits (Telemedicine).  Patients are able to view lab/test results, encounter notes, upcoming appointments, etc.  Non-urgent messages can be sent to your provider as well.   To learn more about what you can do with MyChart, go to [email or web address].     Your next appointment:   1 YEAR    Provider:   DR. [facility] PATWARDHAN

## 2023-11-22 DIAGNOSIS — R7301 Impaired fasting glucose: Secondary | ICD-10-CM | POA: Diagnosis not present

## 2023-11-22 DIAGNOSIS — I10 Essential (primary) hypertension: Secondary | ICD-10-CM | POA: Diagnosis not present

## 2023-11-22 DIAGNOSIS — I5022 Chronic systolic (congestive) heart failure: Secondary | ICD-10-CM | POA: Diagnosis not present

## 2023-11-22 DIAGNOSIS — Z0000 Encounter for general adult medical examination without abnormal findings: Secondary | ICD-10-CM | POA: Diagnosis not present

## 2023-11-22 DIAGNOSIS — I2510 Atherosclerotic heart disease of native coronary artery without angina pectoris: Secondary | ICD-10-CM | POA: Diagnosis not present

## 2023-11-22 DIAGNOSIS — J449 Chronic obstructive pulmonary disease, unspecified: Secondary | ICD-10-CM | POA: Diagnosis not present

## 2023-11-22 DIAGNOSIS — E785 Hyperlipidemia, unspecified: Secondary | ICD-10-CM | POA: Diagnosis not present

## 2023-11-22 DIAGNOSIS — I255 Ischemic cardiomyopathy: Secondary | ICD-10-CM | POA: Diagnosis not present

## 2023-11-27 DIAGNOSIS — G8918 Other acute postprocedural pain: Secondary | ICD-10-CM | POA: Diagnosis not present

## 2023-11-27 DIAGNOSIS — S86392A Other injury of muscle(s) and tendon(s) of peroneal muscle group at lower leg level, left leg, initial encounter: Secondary | ICD-10-CM | POA: Diagnosis not present

## 2023-11-27 DIAGNOSIS — S8362XA Sprain of the superior tibiofibular joint and ligament, left knee, initial encounter: Secondary | ICD-10-CM | POA: Diagnosis not present

## 2023-11-27 DIAGNOSIS — M66372 Spontaneous rupture of flexor tendons, left ankle and foot: Secondary | ICD-10-CM | POA: Diagnosis not present

## 2023-11-27 DIAGNOSIS — S86012A Strain of left Achilles tendon, initial encounter: Secondary | ICD-10-CM | POA: Diagnosis not present

## 2023-11-27 DIAGNOSIS — M66872 Spontaneous rupture of other tendons, left ankle and foot: Secondary | ICD-10-CM | POA: Diagnosis not present

## 2023-11-27 DIAGNOSIS — S8262XA Displaced fracture of lateral malleolus of left fibula, initial encounter for closed fracture: Secondary | ICD-10-CM | POA: Diagnosis not present

## 2023-11-27 DIAGNOSIS — S82402A Unspecified fracture of shaft of left fibula, initial encounter for closed fracture: Secondary | ICD-10-CM | POA: Diagnosis not present

## 2023-11-27 DIAGNOSIS — S86302A Unspecified injury of muscle(s) and tendon(s) of peroneal muscle group at lower leg level, left leg, initial encounter: Secondary | ICD-10-CM | POA: Diagnosis not present

## 2023-11-30 ENCOUNTER — Other Ambulatory Visit: Payer: Self-pay | Admitting: Cardiology

## 2023-11-30 DIAGNOSIS — K219 Gastro-esophageal reflux disease without esophagitis: Secondary | ICD-10-CM

## 2023-12-12 DIAGNOSIS — S86012D Strain of left Achilles tendon, subsequent encounter: Secondary | ICD-10-CM | POA: Diagnosis not present

## 2023-12-26 DIAGNOSIS — Z4789 Encounter for other orthopedic aftercare: Secondary | ICD-10-CM | POA: Diagnosis not present

## 2023-12-26 DIAGNOSIS — S86012D Strain of left Achilles tendon, subsequent encounter: Secondary | ICD-10-CM | POA: Diagnosis not present

## 2024-03-10 DIAGNOSIS — S86012D Strain of left Achilles tendon, subsequent encounter: Secondary | ICD-10-CM | POA: Diagnosis not present

## 2024-03-21 DIAGNOSIS — M25672 Stiffness of left ankle, not elsewhere classified: Secondary | ICD-10-CM | POA: Diagnosis not present

## 2024-03-21 DIAGNOSIS — M25572 Pain in left ankle and joints of left foot: Secondary | ICD-10-CM | POA: Diagnosis not present

## 2024-03-21 DIAGNOSIS — M6281 Muscle weakness (generalized): Secondary | ICD-10-CM | POA: Diagnosis not present

## 2024-03-24 DIAGNOSIS — M25672 Stiffness of left ankle, not elsewhere classified: Secondary | ICD-10-CM | POA: Diagnosis not present

## 2024-03-24 DIAGNOSIS — M6281 Muscle weakness (generalized): Secondary | ICD-10-CM | POA: Diagnosis not present

## 2024-03-24 DIAGNOSIS — M25572 Pain in left ankle and joints of left foot: Secondary | ICD-10-CM | POA: Diagnosis not present

## 2024-03-31 DIAGNOSIS — M25672 Stiffness of left ankle, not elsewhere classified: Secondary | ICD-10-CM | POA: Diagnosis not present

## 2024-03-31 DIAGNOSIS — M25572 Pain in left ankle and joints of left foot: Secondary | ICD-10-CM | POA: Diagnosis not present

## 2024-03-31 DIAGNOSIS — M6281 Muscle weakness (generalized): Secondary | ICD-10-CM | POA: Diagnosis not present

## 2024-04-08 DIAGNOSIS — M25572 Pain in left ankle and joints of left foot: Secondary | ICD-10-CM | POA: Diagnosis not present

## 2024-04-08 DIAGNOSIS — M25672 Stiffness of left ankle, not elsewhere classified: Secondary | ICD-10-CM | POA: Diagnosis not present

## 2024-04-08 DIAGNOSIS — M6281 Muscle weakness (generalized): Secondary | ICD-10-CM | POA: Diagnosis not present

## 2024-04-14 DIAGNOSIS — M25672 Stiffness of left ankle, not elsewhere classified: Secondary | ICD-10-CM | POA: Diagnosis not present

## 2024-04-14 DIAGNOSIS — M25572 Pain in left ankle and joints of left foot: Secondary | ICD-10-CM | POA: Diagnosis not present

## 2024-04-14 DIAGNOSIS — M6281 Muscle weakness (generalized): Secondary | ICD-10-CM | POA: Diagnosis not present

## 2024-04-28 DIAGNOSIS — M25672 Stiffness of left ankle, not elsewhere classified: Secondary | ICD-10-CM | POA: Diagnosis not present

## 2024-04-28 DIAGNOSIS — M6281 Muscle weakness (generalized): Secondary | ICD-10-CM | POA: Diagnosis not present

## 2024-04-28 DIAGNOSIS — M25572 Pain in left ankle and joints of left foot: Secondary | ICD-10-CM | POA: Diagnosis not present

## 2024-05-05 DIAGNOSIS — M25672 Stiffness of left ankle, not elsewhere classified: Secondary | ICD-10-CM | POA: Diagnosis not present

## 2024-05-05 DIAGNOSIS — M25572 Pain in left ankle and joints of left foot: Secondary | ICD-10-CM | POA: Diagnosis not present

## 2024-05-05 DIAGNOSIS — M6281 Muscle weakness (generalized): Secondary | ICD-10-CM | POA: Diagnosis not present

## 2024-07-07 DIAGNOSIS — K219 Gastro-esophageal reflux disease without esophagitis: Secondary | ICD-10-CM | POA: Diagnosis not present

## 2024-07-07 DIAGNOSIS — I10 Essential (primary) hypertension: Secondary | ICD-10-CM | POA: Diagnosis not present

## 2024-07-07 DIAGNOSIS — R7303 Prediabetes: Secondary | ICD-10-CM | POA: Diagnosis not present

## 2024-07-07 DIAGNOSIS — E785 Hyperlipidemia, unspecified: Secondary | ICD-10-CM | POA: Diagnosis not present

## 2024-11-26 ENCOUNTER — Other Ambulatory Visit: Payer: Self-pay | Admitting: Cardiology

## 2024-11-26 DIAGNOSIS — K219 Gastro-esophageal reflux disease without esophagitis: Secondary | ICD-10-CM

## 2024-11-26 MED ORDER — PANTOPRAZOLE SODIUM 40 MG PO TBEC: 40.0000 mg | DELAYED_RELEASE_TABLET | Freq: Every day | ORAL | 0 refills | Status: AC

## 2024-12-19 ENCOUNTER — Other Ambulatory Visit: Payer: Self-pay | Admitting: Cardiology

## 2024-12-19 DIAGNOSIS — K219 Gastro-esophageal reflux disease without esophagitis: Secondary | ICD-10-CM
